# Patient Record
Sex: Male | Born: 1950 | Race: White | Hispanic: No | Marital: Single | Smoking: Never smoker
Health system: Southern US, Community
[De-identification: ages and names within clinical notes are randomized; demographics above are authoritative.]

## PROBLEM LIST (undated history)

## (undated) DIAGNOSIS — F32A Depression, unspecified: Secondary | ICD-10-CM

## (undated) DIAGNOSIS — K5792 Diverticulitis of intestine, part unspecified, without perforation or abscess without bleeding: Secondary | ICD-10-CM

## (undated) DIAGNOSIS — G4730 Sleep apnea, unspecified: Secondary | ICD-10-CM

## (undated) DIAGNOSIS — IMO0001 Reserved for inherently not codable concepts without codable children: Secondary | ICD-10-CM

## (undated) DIAGNOSIS — J40 Bronchitis, not specified as acute or chronic: Secondary | ICD-10-CM

## (undated) DIAGNOSIS — N400 Enlarged prostate without lower urinary tract symptoms: Secondary | ICD-10-CM

## (undated) DIAGNOSIS — F329 Major depressive disorder, single episode, unspecified: Secondary | ICD-10-CM

## (undated) DIAGNOSIS — K649 Unspecified hemorrhoids: Secondary | ICD-10-CM

## (undated) DIAGNOSIS — I10 Essential (primary) hypertension: Secondary | ICD-10-CM

## (undated) HISTORY — PX: COLONOSCOPY: SHX174

## (undated) HISTORY — PX: HERNIA REPAIR: SHX51

## (undated) HISTORY — PX: SIGMOIDOSCOPY: SUR1295

---

## 2015-02-12 ENCOUNTER — Encounter (HOSPITAL_COMMUNITY): Payer: Self-pay | Admitting: *Deleted

## 2015-02-12 ENCOUNTER — Emergency Department (HOSPITAL_COMMUNITY)
Admission: EM | Admit: 2015-02-12 | Discharge: 2015-02-12 | Disposition: A | Discharge status: Home / Self Care | Payer: Medicare Other | Attending: Emergency Medicine | Admitting: Emergency Medicine

## 2015-02-12 DIAGNOSIS — R509 Fever, unspecified: Secondary | ICD-10-CM | POA: Diagnosis not present

## 2015-02-12 DIAGNOSIS — R140 Abdominal distension (gaseous): Secondary | ICD-10-CM | POA: Insufficient documentation

## 2015-02-12 DIAGNOSIS — R110 Nausea: Secondary | ICD-10-CM | POA: Diagnosis not present

## 2015-02-12 DIAGNOSIS — I10 Essential (primary) hypertension: Secondary | ICD-10-CM | POA: Insufficient documentation

## 2015-02-12 DIAGNOSIS — R1084 Generalized abdominal pain: Secondary | ICD-10-CM | POA: Insufficient documentation

## 2015-02-12 DIAGNOSIS — Z79899 Other long term (current) drug therapy: Secondary | ICD-10-CM | POA: Insufficient documentation

## 2015-02-12 DIAGNOSIS — Z8719 Personal history of other diseases of the digestive system: Secondary | ICD-10-CM | POA: Diagnosis not present

## 2015-02-12 HISTORY — DX: Diverticulitis of intestine, part unspecified, without perforation or abscess without bleeding: K57.92

## 2015-02-12 HISTORY — DX: Essential (primary) hypertension: I10

## 2015-02-12 LAB — CBC
HEMATOCRIT: 48 % (ref 39.0–52.0)
Hemoglobin: 17.1 g/dL — ABNORMAL HIGH (ref 13.0–17.0)
MCH: 31.7 pg (ref 26.0–34.0)
MCHC: 35.6 g/dL (ref 30.0–36.0)
MCV: 88.9 fL (ref 78.0–100.0)
Platelets: 186 10*3/uL (ref 150–400)
RBC: 5.4 MIL/uL (ref 4.22–5.81)
RDW: 12.4 % (ref 11.5–15.5)
WBC: 8.1 10*3/uL (ref 4.0–10.5)

## 2015-02-12 LAB — COMPREHENSIVE METABOLIC PANEL
ALT: 20 U/L (ref 17–63)
ANION GAP: 9 (ref 5–15)
AST: 18 U/L (ref 15–41)
Albumin: 4.3 g/dL (ref 3.5–5.0)
Alkaline Phosphatase: 58 U/L (ref 38–126)
BUN: 11 mg/dL (ref 6–20)
CHLORIDE: 99 mmol/L — AB (ref 101–111)
CO2: 23 mmol/L (ref 22–32)
Calcium: 9.5 mg/dL (ref 8.9–10.3)
Creatinine, Ser: 0.81 mg/dL (ref 0.61–1.24)
Glucose, Bld: 119 mg/dL — ABNORMAL HIGH (ref 65–99)
POTASSIUM: 4 mmol/L (ref 3.5–5.1)
Sodium: 131 mmol/L — ABNORMAL LOW (ref 135–145)
TOTAL PROTEIN: 7.3 g/dL (ref 6.5–8.1)
Total Bilirubin: 0.9 mg/dL (ref 0.3–1.2)

## 2015-02-12 LAB — URINALYSIS, ROUTINE W REFLEX MICROSCOPIC
Glucose, UA: NEGATIVE mg/dL
Hgb urine dipstick: NEGATIVE
KETONES UR: 40 mg/dL — AB
LEUKOCYTES UA: NEGATIVE
NITRITE: NEGATIVE
PH: 5 (ref 5.0–8.0)
Protein, ur: 30 mg/dL — AB
SPECIFIC GRAVITY, URINE: 1.027 (ref 1.005–1.030)
UROBILINOGEN UA: 1 mg/dL (ref 0.0–1.0)

## 2015-02-12 LAB — URINE MICROSCOPIC-ADD ON

## 2015-02-12 LAB — LIPASE, BLOOD: LIPASE: 18 U/L — AB (ref 22–51)

## 2015-02-12 MED ORDER — HYDROCODONE-ACETAMINOPHEN 5-325 MG PO TABS: 2.0000 | ORAL_TABLET | ORAL | Status: DC | PRN

## 2015-02-12 MED ORDER — CIPROFLOXACIN HCL 500 MG PO TABS: 500.0000 mg | ORAL_TABLET | Freq: Two times a day (BID) | ORAL | Status: DC

## 2015-02-12 MED ORDER — HYOSCYAMINE SULFATE 0.125 MG SL SUBL
0.1250 mg | SUBLINGUAL_TABLET | Freq: Once | SUBLINGUAL | Status: AC
Start: 2015-02-12 — End: 2015-02-12
  Administered 2015-02-12: 0.125 mg via SUBLINGUAL

## 2015-02-12 MED ORDER — CIPROFLOXACIN HCL 500 MG PO TABS
500.0000 mg | ORAL_TABLET | Freq: Once | ORAL | Status: AC
  Administered 2015-02-12: 500 mg via ORAL
  Filled 2015-02-12: qty 1

## 2015-02-12 MED ORDER — METRONIDAZOLE 500 MG PO TABS
500.0000 mg | ORAL_TABLET | Freq: Once | ORAL | Status: AC
  Administered 2015-02-12: 500 mg via ORAL
  Filled 2015-02-12: qty 1

## 2015-02-12 MED ORDER — HYOSCYAMINE SULFATE 0.125 MG PO TABS
0.1250 mg | ORAL_TABLET | Freq: Once | ORAL | Status: DC
  Filled 2015-02-12: qty 1

## 2015-02-12 MED ORDER — ONDANSETRON HCL 4 MG/2ML IJ SOLN: 4.0000 mg | Freq: Once | INTRAMUSCULAR | Status: DC

## 2015-02-12 MED ORDER — SODIUM CHLORIDE 0.9 % IV BOLUS (SEPSIS): 1000.0000 mL | Freq: Once | INTRAVENOUS | Status: DC

## 2015-02-12 MED ORDER — METRONIDAZOLE 500 MG PO TABS: 500.0000 mg | ORAL_TABLET | Freq: Three times a day (TID) | ORAL | Status: DC

## 2015-02-12 MED ORDER — MORPHINE SULFATE (PF) 4 MG/ML IV SOLN: 4.0000 mg | Freq: Once | INTRAVENOUS | Status: DC

## 2015-02-12 NOTE — ED Notes (Signed)
EDP at bedside.  Pt given urinal to give urine sample.

## 2015-02-12 NOTE — ED Notes (Signed)
Pt has history of diverticulitis and here with lower abdominal pain and denies blood in stool.

## 2015-02-12 NOTE — ED Provider Notes (Signed)
CSN: [id number]     Arrival date & time 02/12/15  1517 History   First MD Initiated Contact with Patient 02/12/15 1707     Chief Complaint  Patient presents with  . Abdominal Pain   Jimmy Pearson is a 64 y.o. male with a past medical history of hypertension and diverticulitis who presents with abdominal pain, nausea, subjective fevers and chills, and abdominal distention. The patient reports that he has had diverticulitis "a bunch of times before" in [place]. The patient reports that he recently moved back to [place] and has complained of abdominal pain for the last 3 days. The patient says that his pain feels similar to his prior episodes and he describes a pain all over his abdomen. The patient says he feels his abdomen is slightly more distended than normal and feels harder than normal. The patient reports pain that comes and goes and is described as a tightness and cramping pain. The patient describes the pain is "extremely severe". The patient reports having slightly smaller bowel movements but denies constipation diarrhea or rectal bleeding. The patient denies any dysuria, hematuria, or difficulty with urination. The patient reports having subjective fevers and chills for the last few days but was afebrile on arrival. The patient denies any chest pain shortness of breath, vomiting, or any other complaints. The patient did describe having some mild nausea. The patient says his abdominal pain did not radiate, was not associated with eating, and happened all throughout the day and night. The patient reports that he does not want "a large workup" because he is concerned about his insurance status currently.   (Consider location/radiation/quality/duration/timing/severity/associated sxs/prior Treatment) Patient is a 64 y.o. male presenting with abdominal pain. The history is provided by the patient. No language interpreter was used.  Abdominal Pain Pain location:  Generalized Pain quality:  aching and cramping   Pain radiates to:  Does not radiate Pain severity:  Severe Onset quality:  Gradual Duration:  4 days Timing:  Intermittent Progression:  Waxing and waning Chronicity:  Recurrent Context: not previous surgeries and not trauma   Relieved by:  Nothing Worsened by:  Nothing tried Ineffective treatments:  None tried Associated symptoms: chills, fever and nausea   Associated symptoms: no chest pain, no constipation, no cough, no diarrhea, no dysuria, no fatigue, no hematemesis, no hematuria, no melena, no shortness of breath and no vomiting   Fever:    Duration:  3 days   Timing:  Intermittent   Temp source:  Subjective   Progression:  Resolved Nausea:    Severity:  Moderate   Onset quality:  Gradual   Duration:  3 days   Timing:  Intermittent   Progression:  Waxing and waning Risk factors: obesity   Risk factors: has not had multiple surgeries     Past Medical History  Diagnosis Date  . Diverticulitis   . Hypertension    Past Surgical History  Procedure Laterality Date  . Hernia repair     No family history on file. Social History  Substance Use Topics  . Smoking status: Never Smoker   . Smokeless tobacco: None  . Alcohol Use: Yes     Comment: wine with meals    Review of Systems  Constitutional: Positive for fever and chills. Negative for diaphoresis, appetite change and fatigue.  HENT: Negative for congestion and rhinorrhea.   Respiratory: Negative for cough, chest tightness, shortness of breath, wheezing and stridor.   Cardiovascular: Negative for chest pain, palpitations and leg  swelling.  Gastrointestinal: Positive for nausea, abdominal pain and abdominal distention. Negative for vomiting, diarrhea, constipation, blood in stool, melena and hematemesis.  Genitourinary: Negative for dysuria, urgency, hematuria, flank pain, discharge, penile pain and testicular pain.  Musculoskeletal: Negative for back pain, neck pain and neck stiffness.   Skin: Negative for rash and wound.  Neurological: Negative for seizures and headaches.  All other systems reviewed and are negative.     Allergies  Review of patient's allergies indicates no known allergies.  Home Medications   Prior to Admission medications   Medication Sig Start Date End Date Taking? Authorizing Provider  amLODipine (NORVASC) 5 MG tablet Take 5 mg by mouth daily.   Yes Historical Provider, MD  benazepril (LOTENSIN) 20 MG tablet Take 20 mg by mouth every evening.   Yes Historical Provider, MD  finasteride (PROSCAR) 5 MG tablet Take 5 mg by mouth daily.   Yes Historical Provider, MD  ibuprofen (ADVIL,MOTRIN) 200 MG tablet Take 400 mg by mouth every 4 (four) hours as needed for mild pain.   Yes Historical Provider, MD  OVER THE COUNTER MEDICATION Take 40 mLs by mouth daily. "Purified Silver"   Yes Historical Provider, MD   BP 161/102 mmHg  Pulse 90  Temp(Src) 98.4 F (36.9 C) (Oral)  Resp 20  Ht  (1.727 m)  Wt 230 lb (104.327 kg)  BMI 34.98 kg/m2  SpO2 99% Physical Exam  Constitutional: He is oriented to person, place, and time. He appears well-developed and well-nourished. No distress.  HENT:  Head: Normocephalic and atraumatic.  Mouth/Throat: No oropharyngeal exudate.  Eyes: Conjunctivae and EOM are normal. Pupils are equal, round, and reactive to light.  Neck: Normal range of motion.  Cardiovascular: Normal rate, normal heart sounds and intact distal pulses.   No murmur heard. Pulmonary/Chest: Effort normal. No stridor. He exhibits no tenderness.  Abdominal: Soft. He exhibits distension. There is generalized tenderness. There is no rigidity, no rebound, no guarding and no CVA tenderness.  Musculoskeletal: He exhibits no tenderness.  Neurological: He is alert and oriented to person, place, and time. He exhibits normal muscle tone.  Skin: Skin is warm. He is not diaphoretic. No erythema.  Psychiatric: He has a normal mood and affect.  Nursing note  and vitals reviewed.   ED Course  Procedures (including critical care time) Labs Review Labs Reviewed  LIPASE, BLOOD - Abnormal; Notable for the following:    Lipase 18 (*)    All other components within normal limits  COMPREHENSIVE METABOLIC PANEL - Abnormal; Notable for the following:    Sodium 131 (*)    Chloride 99 (*)    Glucose, Bld 119 (*)    All other components within normal limits  CBC - Abnormal; Notable for the following:    Hemoglobin 17.1 (*)    All other components within normal limits  URINALYSIS, ROUTINE W REFLEX MICROSCOPIC (NOT AT [facility]) - Abnormal; Notable for the following:    Color, Urine ORANGE (*)    Bilirubin Urine MODERATE (*)    Ketones, ur 40 (*)    Protein, ur 30 (*)    All other components within normal limits  URINE MICROSCOPIC-ADD ON - Abnormal; Notable for the following:    Casts HYALINE CASTS (*)    All other components within normal limits  URINE CULTURE    Imaging Review No results found. I have personally reviewed and evaluated these images and lab results as part of my medical decision-making.   EKG Interpretation  None      MDM   Jimmy Pearson is a 63 y.o. male with a past medical history of hypertension and diverticulitis who presents with abdominal pain, nausea, subjective fevers and chills, and abdominal distention. The patient's primary concern is for diverticulitis given his similar presentation and symptoms in the past with that diagnosis. Based on the patient's abdominal pain, nausea, and subjective fevers and chills, diverticulitis is in the differential diagnosis list; however, other etiologies are also considered including mass, abscess, inflammatory bowel disease, incarcerated hernia, obstruction, and ischemic bowel.   The patient's initial blood testing as drawn by nursing orders in triage revealed a on elevated lipase, a grossly unremarkable CMP with normal LFTs and kidney function, and an unremarkable CBC. A lactic acid was  considered however, the patient is very concerned about the cost of his visit and he is requesting that not be drawn at this time. The patient agreed to have a urinalysis to look for evidence of infection in his urine. A CT scan of the abdomen and pelvis was ordered by this examiner however, the patient reported that he did not want to pay for that type of test at this time. The patient is primarily concerned that this is diverticulitis and he is not interested in having other imaging done today.   The patient had diagnostic laboratory and imaging results as seen above. The patient's urinalysis did not show evidence of infection. The patient's lipase was not elevated, his CMP did not reveal evidence of significant electrolyte abnormality or kidney injury. The patient's liver function tests were unremarkable. The patient's CBC did not show evidence of leukocytosis or anemia.  The patient was reassured about his lab results. As the patient is deferring the rest of his workup including imaging at this time, the patient will be empirically treated with Cipro and Flagyl for presumed diverticulitis given the similarity to prior episodes. The patient will be given prescriptions for antibiotics at discharge and the patient will be given a prescription for several days of pain medication. The patient was instructed to follow-up with a primary care provider for further ongoing management of his diverticulitis and symptoms.   The patient did not have any other problems or any emergent department and was discharged in good condition. The patient understood return precautions for signs and symptoms of worsening diverticulitis, perforation, or other problems. Next  This patient was seen with Dr. Madilyn Hook, emergency medicine attending.   Final diagnoses:  Generalized abdominal pain        Theda Belfast, MD 02/13/15 [id number]  Tilden Fossa, MD 02/15/15 1344

## 2015-02-13 LAB — URINE CULTURE: CULTURE: NO GROWTH

## 2015-02-26 DIAGNOSIS — N400 Enlarged prostate without lower urinary tract symptoms: Secondary | ICD-10-CM | POA: Diagnosis not present

## 2015-02-26 DIAGNOSIS — Z6833 Body mass index (BMI) 33.0-33.9, adult: Secondary | ICD-10-CM | POA: Diagnosis not present

## 2015-02-26 DIAGNOSIS — L509 Urticaria, unspecified: Secondary | ICD-10-CM | POA: Diagnosis not present

## 2015-02-26 DIAGNOSIS — I10 Essential (primary) hypertension: Secondary | ICD-10-CM | POA: Diagnosis not present

## 2015-02-26 DIAGNOSIS — Z789 Other specified health status: Secondary | ICD-10-CM | POA: Diagnosis not present

## 2015-02-26 DIAGNOSIS — E668 Other obesity: Secondary | ICD-10-CM | POA: Diagnosis not present

## 2015-02-26 DIAGNOSIS — Z418 Encounter for other procedures for purposes other than remedying health state: Secondary | ICD-10-CM | POA: Diagnosis not present

## 2015-09-11 ENCOUNTER — Inpatient Hospital Stay (HOSPITAL_COMMUNITY)
Admission: EM | Admit: 2015-09-11 | Discharge: 2015-09-16 | DRG: 330 | Disposition: A | Discharge status: Home / Self Care | Payer: Medicare Other | Attending: General Surgery | Admitting: General Surgery

## 2015-09-11 ENCOUNTER — Encounter (HOSPITAL_COMMUNITY): Admission: EM | Disposition: A | Discharge status: Home / Self Care | Payer: Self-pay | Source: Home / Self Care

## 2015-09-11 ENCOUNTER — Emergency Department (HOSPITAL_COMMUNITY): Payer: Medicare Other

## 2015-09-11 ENCOUNTER — Inpatient Hospital Stay (HOSPITAL_COMMUNITY): Payer: Medicare Other | Admitting: Certified Registered Nurse Anesthetist

## 2015-09-11 ENCOUNTER — Encounter (HOSPITAL_COMMUNITY): Payer: Self-pay | Admitting: Emergency Medicine

## 2015-09-11 DIAGNOSIS — K5732 Diverticulitis of large intestine without perforation or abscess without bleeding: Secondary | ICD-10-CM | POA: Diagnosis not present

## 2015-09-11 DIAGNOSIS — E662 Morbid (severe) obesity with alveolar hypoventilation: Secondary | ICD-10-CM | POA: Diagnosis not present

## 2015-09-11 DIAGNOSIS — J9621 Acute and chronic respiratory failure with hypoxia: Secondary | ICD-10-CM | POA: Diagnosis not present

## 2015-09-11 DIAGNOSIS — Z800 Family history of malignant neoplasm of digestive organs: Secondary | ICD-10-CM | POA: Diagnosis not present

## 2015-09-11 DIAGNOSIS — I509 Heart failure, unspecified: Secondary | ICD-10-CM | POA: Diagnosis not present

## 2015-09-11 DIAGNOSIS — G4733 Obstructive sleep apnea (adult) (pediatric): Secondary | ICD-10-CM | POA: Diagnosis not present

## 2015-09-11 DIAGNOSIS — K5750 Diverticulosis of both small and large intestine without perforation or abscess without bleeding: Secondary | ICD-10-CM | POA: Diagnosis not present

## 2015-09-11 DIAGNOSIS — N400 Enlarged prostate without lower urinary tract symptoms: Secondary | ICD-10-CM | POA: Diagnosis present

## 2015-09-11 DIAGNOSIS — R0902 Hypoxemia: Secondary | ICD-10-CM | POA: Diagnosis not present

## 2015-09-11 DIAGNOSIS — K5900 Constipation, unspecified: Secondary | ICD-10-CM | POA: Diagnosis not present

## 2015-09-11 DIAGNOSIS — I10 Essential (primary) hypertension: Secondary | ICD-10-CM | POA: Diagnosis not present

## 2015-09-11 DIAGNOSIS — R0982 Postnasal drip: Secondary | ICD-10-CM | POA: Diagnosis not present

## 2015-09-11 DIAGNOSIS — K639 Disease of intestine, unspecified: Secondary | ICD-10-CM | POA: Diagnosis not present

## 2015-09-11 DIAGNOSIS — K3580 Unspecified acute appendicitis: Secondary | ICD-10-CM | POA: Diagnosis present

## 2015-09-11 DIAGNOSIS — K5792 Diverticulitis of intestine, part unspecified, without perforation or abscess without bleeding: Secondary | ICD-10-CM | POA: Diagnosis not present

## 2015-09-11 DIAGNOSIS — E669 Obesity, unspecified: Secondary | ICD-10-CM | POA: Diagnosis present

## 2015-09-11 DIAGNOSIS — R1084 Generalized abdominal pain: Secondary | ICD-10-CM | POA: Diagnosis not present

## 2015-09-11 DIAGNOSIS — K5669 Other intestinal obstruction: Secondary | ICD-10-CM | POA: Diagnosis present

## 2015-09-11 DIAGNOSIS — K6389 Other specified diseases of intestine: Secondary | ICD-10-CM | POA: Diagnosis present

## 2015-09-11 DIAGNOSIS — R0602 Shortness of breath: Secondary | ICD-10-CM | POA: Diagnosis not present

## 2015-09-11 DIAGNOSIS — R1032 Left lower quadrant pain: Secondary | ICD-10-CM

## 2015-09-11 DIAGNOSIS — Z6833 Body mass index (BMI) 33.0-33.9, adult: Secondary | ICD-10-CM

## 2015-09-11 DIAGNOSIS — R109 Unspecified abdominal pain: Secondary | ICD-10-CM | POA: Diagnosis not present

## 2015-09-11 DIAGNOSIS — K56609 Unspecified intestinal obstruction, unspecified as to partial versus complete obstruction: Secondary | ICD-10-CM | POA: Diagnosis present

## 2015-09-11 DIAGNOSIS — K5660 Unspecified intestinal obstruction: Secondary | ICD-10-CM | POA: Diagnosis not present

## 2015-09-11 DIAGNOSIS — R110 Nausea: Secondary | ICD-10-CM | POA: Diagnosis not present

## 2015-09-11 HISTORY — PX: COLON RESECTION: SHX5231

## 2015-09-11 HISTORY — PX: COLOSTOMY: SHX63

## 2015-09-11 HISTORY — PX: LAPAROTOMY: SHX154

## 2015-09-11 LAB — URINALYSIS, ROUTINE W REFLEX MICROSCOPIC
GLUCOSE, UA: NEGATIVE mg/dL
Hgb urine dipstick: NEGATIVE
KETONES UR: 15 mg/dL — AB
LEUKOCYTES UA: NEGATIVE
NITRITE: NEGATIVE
PH: 5.5 (ref 5.0–8.0)
Protein, ur: NEGATIVE mg/dL
SPECIFIC GRAVITY, URINE: 1.031 — AB (ref 1.005–1.030)

## 2015-09-11 LAB — CBC WITH DIFFERENTIAL/PLATELET
BASOS ABS: 0 10*3/uL (ref 0.0–0.1)
BASOS PCT: 0 %
Eosinophils Absolute: 0 10*3/uL (ref 0.0–0.7)
Eosinophils Relative: 0 %
HEMATOCRIT: 47.7 % (ref 39.0–52.0)
HEMOGLOBIN: 15.6 g/dL (ref 13.0–17.0)
LYMPHS PCT: 16 %
Lymphs Abs: 1.1 10*3/uL (ref 0.7–4.0)
MCH: 30.3 pg (ref 26.0–34.0)
MCHC: 32.7 g/dL (ref 30.0–36.0)
MCV: 92.6 fL (ref 78.0–100.0)
Monocytes Absolute: 0.4 10*3/uL (ref 0.1–1.0)
Monocytes Relative: 5 %
NEUTROS ABS: 5.3 10*3/uL (ref 1.7–7.7)
NEUTROS PCT: 79 %
Platelets: 174 10*3/uL (ref 150–400)
RBC: 5.15 MIL/uL (ref 4.22–5.81)
RDW: 12.8 % (ref 11.5–15.5)
WBC: 6.7 10*3/uL (ref 4.0–10.5)

## 2015-09-11 LAB — COMPREHENSIVE METABOLIC PANEL
ALBUMIN: 4.1 g/dL (ref 3.5–5.0)
ALK PHOS: 56 U/L (ref 38–126)
ALT: 20 U/L (ref 17–63)
AST: 16 U/L (ref 15–41)
Anion gap: 12 (ref 5–15)
BILIRUBIN TOTAL: 0.8 mg/dL (ref 0.3–1.2)
BUN: 12 mg/dL (ref 6–20)
CO2: 24 mmol/L (ref 22–32)
CREATININE: 0.74 mg/dL (ref 0.61–1.24)
Calcium: 9.4 mg/dL (ref 8.9–10.3)
Chloride: 99 mmol/L — ABNORMAL LOW (ref 101–111)
GFR calc Af Amer: >60 mL/min (ref >60)
GLUCOSE: 127 mg/dL — AB (ref 65–99)
POTASSIUM: 3.9 mmol/L (ref 3.5–5.1)
Sodium: 135 mmol/L (ref 135–145)
TOTAL PROTEIN: 7.2 g/dL (ref 6.5–8.1)

## 2015-09-11 LAB — LIPASE, BLOOD: Lipase: 31 U/L (ref 11–51)

## 2015-09-11 LAB — GLUCOSE, CAPILLARY: Glucose-Capillary: 121 mg/dL — ABNORMAL HIGH (ref 65–99)

## 2015-09-11 SURGERY — COLON RESECTION
Anesthesia: General | Site: Abdomen

## 2015-09-11 MED ORDER — LACTATED RINGERS IV SOLN
INTRAVENOUS | Status: DC
  Administered 2015-09-11 (×3): via INTRAVENOUS

## 2015-09-11 MED ORDER — ONDANSETRON HCL 4 MG/2ML IJ SOLN
INTRAMUSCULAR | Status: AC
  Filled 2015-09-11: qty 2

## 2015-09-11 MED ORDER — PHENYLEPHRINE 40 MCG/ML (10ML) SYRINGE FOR IV PUSH (FOR BLOOD PRESSURE SUPPORT)
PREFILLED_SYRINGE | INTRAVENOUS | Status: AC
  Filled 2015-09-11: qty 10

## 2015-09-11 MED ORDER — BENAZEPRIL HCL 10 MG PO TABS
20.0000 mg | ORAL_TABLET | Freq: Every evening | ORAL | Status: DC
  Administered 2015-09-11: 20 mg via ORAL
  Filled 2015-09-11: qty 2

## 2015-09-11 MED ORDER — HYDROMORPHONE 1 MG/ML IV SOLN
INTRAVENOUS | Status: DC
  Administered 2015-09-11: 19:00:00 via INTRAVENOUS
  Administered 2015-09-12: 0.3 mg via INTRAVENOUS
  Administered 2015-09-12 (×2): 0.6 mg via INTRAVENOUS
  Administered 2015-09-12: 0.3 mg via INTRAVENOUS
  Administered 2015-09-12: 0.6 mg via INTRAVENOUS
  Administered 2015-09-12: 0.9 mg via INTRAVENOUS
  Administered 2015-09-13: 1.8 mg via INTRAVENOUS
  Administered 2015-09-13 (×2): 0.9 mg via INTRAVENOUS
  Administered 2015-09-13: 0.3 mg via INTRAVENOUS
  Administered 2015-09-13: 1.8 mg via INTRAVENOUS
  Administered 2015-09-14: 0.9 mg via INTRAVENOUS
  Administered 2015-09-14: 0.3 mg via INTRAVENOUS
  Administered 2015-09-14: 1.8 mg via INTRAVENOUS
  Administered 2015-09-14: 1.5 mg via INTRAVENOUS
  Administered 2015-09-14: 1.8 mg via INTRAVENOUS
  Administered 2015-09-14: 1.5 mg via INTRAVENOUS
  Administered 2015-09-15 (×2): 1.2 mg via INTRAVENOUS
  Administered 2015-09-15: 0.6 mg via INTRAVENOUS
  Filled 2015-09-11: qty 25

## 2015-09-11 MED ORDER — FINASTERIDE 5 MG PO TABS
5.0000 mg | ORAL_TABLET | Freq: Every day | ORAL | Status: DC
Start: 2015-09-11 — End: 2015-09-12
  Administered 2015-09-11: 5 mg via ORAL
  Filled 2015-09-11 (×2): qty 1

## 2015-09-11 MED ORDER — SODIUM CHLORIDE 0.9 % IV BOLUS (SEPSIS)
1000.0000 mL | Freq: Once | INTRAVENOUS | Status: AC
  Administered 2015-09-11: 1000 mL via INTRAVENOUS

## 2015-09-11 MED ORDER — MIDAZOLAM HCL 5 MG/5ML IJ SOLN
INTRAMUSCULAR | Status: DC | PRN
  Administered 2015-09-11: 2 mg via INTRAVENOUS

## 2015-09-11 MED ORDER — SUGAMMADEX SODIUM 500 MG/5ML IV SOLN
INTRAVENOUS | Status: DC | PRN
  Administered 2015-09-11: 250 mg via INTRAVENOUS

## 2015-09-11 MED ORDER — PHENYLEPHRINE HCL 10 MG/ML IJ SOLN
INTRAMUSCULAR | Status: DC | PRN
  Administered 2015-09-11: 160 ug via INTRAVENOUS
  Administered 2015-09-11: 240 ug via INTRAVENOUS

## 2015-09-11 MED ORDER — NALOXONE HCL 0.4 MG/ML IJ SOLN: 0.4000 mg | INTRAMUSCULAR | Status: DC | PRN

## 2015-09-11 MED ORDER — FENTANYL CITRATE (PF) 250 MCG/5ML IJ SOLN
INTRAMUSCULAR | Status: AC
  Filled 2015-09-11: qty 5

## 2015-09-11 MED ORDER — ONDANSETRON 4 MG PO TBDP
4.0000 mg | ORAL_TABLET | Freq: Four times a day (QID) | ORAL | Status: DC | PRN
  Filled 2015-09-11: qty 1

## 2015-09-11 MED ORDER — METHOCARBAMOL 500 MG PO TABS
ORAL_TABLET | ORAL | Status: AC
  Administered 2015-09-11: 500 mg via ORAL
  Filled 2015-09-11: qty 1

## 2015-09-11 MED ORDER — ONDANSETRON HCL 4 MG/2ML IJ SOLN
INTRAMUSCULAR | Status: DC | PRN
  Administered 2015-09-11: 4 mg via INTRAVENOUS

## 2015-09-11 MED ORDER — LACTATED RINGERS IV SOLN: INTRAVENOUS | Status: DC

## 2015-09-11 MED ORDER — HYDROMORPHONE HCL 1 MG/ML IJ SOLN: 1.0000 mg | INTRAMUSCULAR | Status: DC | PRN

## 2015-09-11 MED ORDER — PANTOPRAZOLE SODIUM 40 MG IV SOLR
40.0000 mg | Freq: Every day | INTRAVENOUS | Status: DC
  Administered 2015-09-11 – 2015-09-14 (×4): 40 mg via INTRAVENOUS
  Filled 2015-09-11 (×4): qty 40

## 2015-09-11 MED ORDER — MEPERIDINE HCL 25 MG/ML IJ SOLN: 6.2500 mg | INTRAMUSCULAR | Status: DC | PRN

## 2015-09-11 MED ORDER — DICYCLOMINE HCL 10 MG/ML IM SOLN
20.0000 mg | Freq: Once | INTRAMUSCULAR | Status: AC
  Administered 2015-09-11: 20 mg via INTRAMUSCULAR
  Filled 2015-09-11: qty 2

## 2015-09-11 MED ORDER — DEXTROSE 5 % IV SOLN
2.0000 g | Freq: Two times a day (BID) | INTRAVENOUS | Status: AC
  Administered 2015-09-11: 2 g via INTRAVENOUS
  Filled 2015-09-11: qty 2

## 2015-09-11 MED ORDER — POTASSIUM CHLORIDE IN NACL 20-0.9 MEQ/L-% IV SOLN
INTRAVENOUS | Status: DC
  Administered 2015-09-11: 22:00:00 via INTRAVENOUS
  Filled 2015-09-11 (×2): qty 1000

## 2015-09-11 MED ORDER — SODIUM CHLORIDE 0.9% FLUSH: 9.0000 mL | INTRAVENOUS | Status: DC | PRN

## 2015-09-11 MED ORDER — MIDAZOLAM HCL 2 MG/2ML IJ SOLN
INTRAMUSCULAR | Status: AC
  Filled 2015-09-11: qty 2

## 2015-09-11 MED ORDER — LACTATED RINGERS IV SOLN
INTRAVENOUS | Status: DC | PRN
  Administered 2015-09-11 (×2): via INTRAVENOUS

## 2015-09-11 MED ORDER — ONDANSETRON HCL 4 MG/2ML IJ SOLN: 4.0000 mg | Freq: Four times a day (QID) | INTRAMUSCULAR | Status: DC | PRN

## 2015-09-11 MED ORDER — PROPOFOL 10 MG/ML IV BOLUS
INTRAVENOUS | Status: DC | PRN
  Administered 2015-09-11: 200 mg via INTRAVENOUS

## 2015-09-11 MED ORDER — DIPHENHYDRAMINE HCL 12.5 MG/5ML PO ELIX: 12.5000 mg | ORAL_SOLUTION | Freq: Four times a day (QID) | ORAL | Status: DC | PRN

## 2015-09-11 MED ORDER — FENTANYL CITRATE (PF) 100 MCG/2ML IJ SOLN
INTRAMUSCULAR | Status: DC | PRN
  Administered 2015-09-11: 150 ug via INTRAVENOUS
  Administered 2015-09-11 (×4): 50 ug via INTRAVENOUS

## 2015-09-11 MED ORDER — MORPHINE SULFATE (PF) 4 MG/ML IV SOLN
4.0000 mg | Freq: Once | INTRAVENOUS | Status: AC | PRN
  Administered 2015-09-11: 4 mg via INTRAVENOUS
  Filled 2015-09-11: qty 1

## 2015-09-11 MED ORDER — HYDROMORPHONE HCL 1 MG/ML IJ SOLN: 0.2500 mg | INTRAMUSCULAR | Status: DC | PRN

## 2015-09-11 MED ORDER — SUCCINYLCHOLINE CHLORIDE 20 MG/ML IJ SOLN
INTRAMUSCULAR | Status: DC | PRN
  Administered 2015-09-11: 120 mg via INTRAVENOUS

## 2015-09-11 MED ORDER — ALBUMIN HUMAN 5 % IV SOLN
INTRAVENOUS | Status: DC | PRN
  Administered 2015-09-11 (×2): via INTRAVENOUS

## 2015-09-11 MED ORDER — HYDRALAZINE HCL 20 MG/ML IJ SOLN: 10.0000 mg | INTRAMUSCULAR | Status: DC | PRN

## 2015-09-11 MED ORDER — PROCHLORPERAZINE EDISYLATE 5 MG/ML IJ SOLN: 10.0000 mg | INTRAMUSCULAR | Status: DC | PRN

## 2015-09-11 MED ORDER — AMLODIPINE BESYLATE 5 MG PO TABS
5.0000 mg | ORAL_TABLET | Freq: Every day | ORAL | Status: DC
  Administered 2015-09-11 – 2015-09-12 (×2): 5 mg via ORAL
  Filled 2015-09-11 (×2): qty 1

## 2015-09-11 MED ORDER — ROCURONIUM BROMIDE 100 MG/10ML IV SOLN
INTRAVENOUS | Status: DC | PRN
  Administered 2015-09-11: 40 mg via INTRAVENOUS
  Administered 2015-09-11: 10 mg via INTRAVENOUS
  Administered 2015-09-11: 20 mg via INTRAVENOUS
  Administered 2015-09-11: 10 mg via INTRAVENOUS
  Administered 2015-09-11: 20 mg via INTRAVENOUS

## 2015-09-11 MED ORDER — DEXTROSE-NACL 5-0.9 % IV SOLN: INTRAVENOUS | Status: DC

## 2015-09-11 MED ORDER — LIDOCAINE HCL (CARDIAC) 20 MG/ML IV SOLN
INTRAVENOUS | Status: DC | PRN
  Administered 2015-09-11: 100 mg via INTRAVENOUS

## 2015-09-11 MED ORDER — HYDRALAZINE HCL 20 MG/ML IJ SOLN: 10.0000 mg | Freq: Four times a day (QID) | INTRAMUSCULAR | Status: DC | PRN

## 2015-09-11 MED ORDER — ROCURONIUM BROMIDE 50 MG/5ML IV SOLN
INTRAVENOUS | Status: AC
  Filled 2015-09-11: qty 1

## 2015-09-11 MED ORDER — HYDROMORPHONE 1 MG/ML IV SOLN
INTRAVENOUS | Status: AC
  Filled 2015-09-11: qty 25

## 2015-09-11 MED ORDER — IOPAMIDOL (ISOVUE-300) INJECTION 61%
100.0000 mL | Freq: Once | INTRAVENOUS | Status: AC | PRN
  Administered 2015-09-11: 100 mL via INTRAVENOUS

## 2015-09-11 MED ORDER — DIPHENHYDRAMINE HCL 50 MG/ML IJ SOLN: 12.5000 mg | Freq: Four times a day (QID) | INTRAMUSCULAR | Status: DC | PRN

## 2015-09-11 MED ORDER — METHOCARBAMOL 500 MG PO TABS
500.0000 mg | ORAL_TABLET | Freq: Four times a day (QID) | ORAL | Status: DC | PRN
  Administered 2015-09-11: 500 mg via ORAL

## 2015-09-11 MED ORDER — DEXTROSE 5 % IV SOLN
2.0000 g | INTRAVENOUS | Status: AC
  Administered 2015-09-11: 2 g via INTRAVENOUS
  Filled 2015-09-11: qty 2

## 2015-09-11 MED ORDER — 0.9 % SODIUM CHLORIDE (POUR BTL) OPTIME
TOPICAL | Status: DC | PRN
  Administered 2015-09-11 (×2): 1000 mL

## 2015-09-11 MED ORDER — ONDANSETRON HCL 4 MG/2ML IJ SOLN
4.0000 mg | Freq: Once | INTRAMUSCULAR | Status: AC
  Administered 2015-09-11: 4 mg via INTRAVENOUS
  Filled 2015-09-11: qty 2

## 2015-09-11 MED ORDER — PROPOFOL 10 MG/ML IV BOLUS
INTRAVENOUS | Status: AC
  Filled 2015-09-11: qty 20

## 2015-09-11 MED ORDER — PHENYLEPHRINE HCL 10 MG/ML IJ SOLN
10.0000 mg | INTRAVENOUS | Status: DC | PRN
  Administered 2015-09-11: 20 ug/min via INTRAVENOUS

## 2015-09-11 MED ORDER — ENOXAPARIN SODIUM 40 MG/0.4ML ~~LOC~~ SOLN
40.0000 mg | SUBCUTANEOUS | Status: DC
  Filled 2015-09-11: qty 0.4

## 2015-09-11 MED ORDER — DEXAMETHASONE SODIUM PHOSPHATE 10 MG/ML IJ SOLN
INTRAMUSCULAR | Status: DC | PRN
  Administered 2015-09-11: 10 mg via INTRAVENOUS

## 2015-09-11 SURGICAL SUPPLY — 63 items
BLADE SURG ROTATE 9660 (MISCELLANEOUS) ×4 IMPLANT
CANISTER SUCTION 2500CC (MISCELLANEOUS) ×4 IMPLANT
CHLORAPREP W/TINT 26ML (MISCELLANEOUS) ×4 IMPLANT
COVER MAYO STAND STRL (DRAPES) IMPLANT
COVER SURGICAL LIGHT HANDLE (MISCELLANEOUS) ×4 IMPLANT
DRAPE LAPAROSCOPIC ABDOMINAL (DRAPES) ×4 IMPLANT
DRAPE PROXIMA HALF (DRAPES) IMPLANT
DRAPE UTILITY XL STRL (DRAPES) IMPLANT
DRAPE WARM FLUID 44X44 (DRAPE) ×4 IMPLANT
DRSG OPSITE POSTOP 4X10 (GAUZE/BANDAGES/DRESSINGS) ×8 IMPLANT
DRSG OPSITE POSTOP 4X8 (GAUZE/BANDAGES/DRESSINGS) ×4 IMPLANT
ELECT BLADE 6.5 EXT (BLADE) ×4 IMPLANT
ELECT CAUTERY BLADE 6.4 (BLADE) ×4 IMPLANT
ELECT REM PT RETURN 9FT ADLT (ELECTROSURGICAL) ×4
ELECTRODE REM PT RTRN 9FT ADLT (ELECTROSURGICAL) ×2 IMPLANT
GLOVE BIO SURGEON STRL SZ7 (GLOVE) ×4 IMPLANT
GLOVE BIO SURGEON STRL SZ8 (GLOVE) ×4 IMPLANT
GLOVE BIOGEL PI IND STRL 7.0 (GLOVE) ×2 IMPLANT
GLOVE BIOGEL PI IND STRL 7.5 (GLOVE) ×2 IMPLANT
GLOVE BIOGEL PI IND STRL 8.5 (GLOVE) ×2 IMPLANT
GLOVE BIOGEL PI INDICATOR 7.0 (GLOVE) ×2
GLOVE BIOGEL PI INDICATOR 7.5 (GLOVE) ×2
GLOVE BIOGEL PI INDICATOR 8.5 (GLOVE) ×2
GLOVE SURG SS PI 7.0 STRL IVOR (GLOVE) ×4 IMPLANT
GOWN STRL REUS W/ TWL LRG LVL3 (GOWN DISPOSABLE) ×6 IMPLANT
GOWN STRL REUS W/TWL LRG LVL3 (GOWN DISPOSABLE) ×6
HANDLE SUCTION POOLE (INSTRUMENTS) ×2 IMPLANT
KIT BASIN OR (CUSTOM PROCEDURE TRAY) ×4 IMPLANT
KIT OSTOMY DRAINABLE 2.75 STR (WOUND CARE) ×4 IMPLANT
KIT ROOM TURNOVER OR (KITS) ×4 IMPLANT
LEGGING LITHOTOMY PAIR STRL (DRAPES) IMPLANT
LIGASURE IMPACT 36 18CM CVD LR (INSTRUMENTS) ×4 IMPLANT
NS IRRIG 1000ML POUR BTL (IV SOLUTION) ×8 IMPLANT
PACK GENERAL/GYN (CUSTOM PROCEDURE TRAY) ×4 IMPLANT
PAD ARMBOARD 7.5X6 YLW CONV (MISCELLANEOUS) ×4 IMPLANT
PENCIL BUTTON HOLSTER BLD 10FT (ELECTRODE) ×4 IMPLANT
RELOAD PROXIMATE 75MM BLUE (ENDOMECHANICALS) ×4 IMPLANT
SPONGE LAP 18X18 X RAY DECT (DISPOSABLE) ×20 IMPLANT
STAPLER CUT CVD 40MM GREEN (STAPLE) ×4 IMPLANT
STAPLER PROXIMATE 75MM BLUE (STAPLE) ×4 IMPLANT
STAPLER VISISTAT 35W (STAPLE) ×4 IMPLANT
SUCTION POOLE HANDLE (INSTRUMENTS) ×4
SUCTION POOLE TIP (SUCTIONS) ×4 IMPLANT
SURGILUBE 2OZ TUBE FLIPTOP (MISCELLANEOUS) IMPLANT
SUT PDS AB 1 TP1 96 (SUTURE) ×8 IMPLANT
SUT PROLENE 2 0 CT2 30 (SUTURE) ×4 IMPLANT
SUT PROLENE 2 0 KS (SUTURE) IMPLANT
SUT SILK 2 0 SH CR/8 (SUTURE) ×4 IMPLANT
SUT SILK 2 0 TIES 10X30 (SUTURE) ×4 IMPLANT
SUT SILK 2 0SH CR/8 30 (SUTURE) ×4 IMPLANT
SUT SILK 3 0 SH CR/8 (SUTURE) ×4 IMPLANT
SUT SILK 3 0 TIES 10X30 (SUTURE) ×4 IMPLANT
SUT VIC AB 3-0 SH 18 (SUTURE) IMPLANT
SUT VIC AB 3-0 SH 27 (SUTURE) ×2
SUT VIC AB 3-0 SH 27X BRD (SUTURE) ×2 IMPLANT
SYR BULB IRRIGATION 50ML (SYRINGE) IMPLANT
TOWEL OR 17X24 6PK STRL BLUE (TOWEL DISPOSABLE) ×4 IMPLANT
TOWEL OR 17X26 10 PK STRL BLUE (TOWEL DISPOSABLE) ×4 IMPLANT
TRAY FOLEY CATH 16FRSI W/METER (SET/KITS/TRAYS/PACK) ×4 IMPLANT
TRAY PROCTOSCOPIC FIBER OPTIC (SET/KITS/TRAYS/PACK) IMPLANT
TUBE CONNECTING 12'X1/4 (SUCTIONS)
TUBE CONNECTING 12X1/4 (SUCTIONS) IMPLANT
YANKAUER SUCT BULB TIP NO VENT (SUCTIONS) ×8 IMPLANT

## 2015-09-11 NOTE — Transfer of Care (Signed)
Immediate Anesthesia Transfer of Care Note  Patient: Jimmy Pearson  Procedure(s) Performed: Procedure(s): COLON RESECTION (N/A) EXPLORATORY LAPAROTOMY COLOSTOMY (N/A)  Patient Location: PACU  Anesthesia Type:General  Level of Consciousness: awake, alert  and oriented  Airway & Oxygen Therapy: Patient Spontanous Breathing and Patient connected to face mask oxygen  Post-op Assessment: Report given to RN and Post -op Vital signs reviewed and stable  Post vital signs: Reviewed and stable  Last Vitals:  Filed Vitals:   09/11/15 1834 09/11/15 1842  BP: 180/96 174/91  Pulse: 96 96  Temp: 36.1 C   Resp: 14 14    Last Pain:  Filed Vitals:   09/11/15 1844  PainSc: 0-No pain         Complications: No apparent anesthesia complications

## 2015-09-11 NOTE — ED Notes (Signed)
PA at bedside.

## 2015-09-11 NOTE — ED Notes (Signed)
PT ambulated to restroom without distress.  

## 2015-09-11 NOTE — ED Notes (Signed)
Patient transported to CT 

## 2015-09-11 NOTE — ED Provider Notes (Signed)
CSN: [id number]     Arrival date & time 09/11/15  0427 History   First MD Initiated Contact with Patient 09/11/15 0506     No chief complaint on file.    (Consider location/radiation/quality/duration/timing/severity/associated sxs/prior Treatment) HPI Comments: 65 year old male with a history of diverticulitis and hypertension presents to the emergency department for evaluation of abdominal pain. Patient reports that he has been experiencing generalized abdominal pain over the past 3 days. Symptoms have been worsening since onset. He now feels most of his discomfort in is left lower quadrant and left flank. He describes his pain as spasms and cramping. Pain is waxing and waning in severity without modifying factors. He correlates his pain to symptoms he previously experienced with diverticulitis. He reports that his most recent episode was 7 months ago which was treated with ciprofloxacin and Flagyl. Patient states that he has felt some mild nausea. He has been constipated and has not had a bowel movement in the last 3 days. He does feel as though his abdomen is more swollen and distended than normal. He has had no fever, vomiting, urinary symptoms. Abdominal surgical history significant for inguinal hernia repair. No medications taken PTA for symptoms.  The history is provided by the patient. No language interpreter was used.    Past Medical History  Diagnosis Date  . Diverticulitis   . Hypertension    Past Surgical History  Procedure Laterality Date  . Hernia repair     No family history on file. Social History  Substance Use Topics  . Smoking status: Never Smoker   . Smokeless tobacco: None  . Alcohol Use: Yes     Comment: wine with meals    Review of Systems  Constitutional: Negative for fever.  Respiratory: Negative for shortness of breath.   Cardiovascular: Negative for chest pain.  Gastrointestinal: Positive for nausea, abdominal pain and constipation. Negative for vomiting  and diarrhea.  Genitourinary: Negative for dysuria.  All other systems reviewed and are negative.   Allergies  Review of patient's allergies indicates no known allergies.  Home Medications   Prior to Admission medications   Medication Sig Start Date End Date Taking? Authorizing Provider  amLODipine (NORVASC) 5 MG tablet Take 5 mg by mouth daily.   Yes Historical Provider, MD  benazepril (LOTENSIN) 20 MG tablet Take 20 mg by mouth every evening.   Yes Historical Provider, MD  finasteride (PROSCAR) 5 MG tablet Take 5 mg by mouth daily.   Yes Historical Provider, MD  ciprofloxacin (CIPRO) 500 MG tablet Take 1 tablet (500 mg total) by mouth 2 (two) times daily. Patient not taking: Reported on 09/11/2015 02/12/15   Theda Belfast, MD  HYDROcodone-acetaminophen (NORCO/VICODIN) 5-325 MG tablet Take 2 tablets by mouth every 4 (four) hours as needed for severe pain. Patient not taking: Reported on 09/11/2015 02/12/15   Theda Belfast, MD  metroNIDAZOLE (FLAGYL) 500 MG tablet Take 1 tablet (500 mg total) by mouth 3 (three) times daily. Patient not taking: Reported on 09/11/2015 02/12/15   Theda Belfast, MD   BP 130/92 mmHg  Pulse 73  Temp(Src) 98.2 F (36.8 C) (Oral)  Resp 17  Ht  (1.727 m)  Wt 108.863 kg  BMI 36.50 kg/m2  SpO2 97%   Physical Exam  Constitutional: He is oriented to person, place, and time. He appears well-developed and well-nourished. No distress.   Nontoxic appearing. Calm, in no distress.  HENT:  Head: Normocephalic and atraumatic.  Eyes: Conjunctivae and EOM are normal. No scleral  icterus.  Neck: Normal range of motion.  Cardiovascular: Normal rate, regular rhythm and intact distal pulses.   Pulmonary/Chest: Effort normal and breath sounds normal. No respiratory distress. He has no wheezes. He has no rales.   Respirations even and unlabored  Abdominal: Soft. He exhibits distension. There is tenderness. There is no rebound and no guarding.  Distended abdomen with TTP in  the LUQ and LLQ.  Hyperactive bowel sounds noted in the bilateral lower quadrants with hypoactive bowel sounds in the bilateral upper quadrants. No masses palpated, though exam is limited secondary to body habitus.  Musculoskeletal: Normal range of motion.  Neurological: He is alert and oriented to person, place, and time. He exhibits normal muscle tone. Coordination normal.  GCS 15. Patient moving all extremities.  Skin: Skin is warm and dry. No rash noted. He is not diaphoretic. No erythema. No pallor.  Psychiatric: He has a normal mood and affect. His behavior is normal.  Nursing note and vitals reviewed.   ED Course  Procedures (including critical care time) Labs Review Labs Reviewed  COMPREHENSIVE METABOLIC PANEL - Abnormal; Notable for the following:    Chloride 99 (*)    Glucose, Bld 127 (*)    All other components within normal limits  URINALYSIS, ROUTINE W REFLEX MICROSCOPIC (NOT AT [facility]) - Abnormal; Notable for the following:    Color, Urine AMBER (*)    APPearance CLOUDY (*)    Specific Gravity, Urine 1.031 (*)    Bilirubin Urine SMALL (*)    Ketones, ur 15 (*)    All other components within normal limits  CBC WITH DIFFERENTIAL/PLATELET  LIPASE, BLOOD    Imaging Review No results found.   I have personally reviewed and evaluated these images and lab results as part of my medical decision-making.   EKG Interpretation None      MDM   Final diagnoses:  Left lower quadrant pain    65 year old male with a history of diverticulitis presents to the emergency department for evaluation of abdominal pain. Patient with reassuring laboratory workup. Pain has improves slightly with Bentyl. Morphine ordered on an as-needed basis for additional pain control. Patient is pending a CT scan to further evaluate his symptoms. Patient care signed out to [facility] Lawyer, PA-C at change of shift who will follow-up on imaging and disposition appropriately.   Filed Vitals:    09/11/15 0530 09/11/15 0543 09/11/15 0600 09/11/15 0630  BP: 132/96  126/96 130/92  Pulse: 82  79 73  Temp:      TempSrc:      Resp: 11  10 17   Height:      Weight:      SpO2: 99% 100% 94% 97%     Antony MaduraKelly Genee Rann, PA-C 09/11/15 [id number]  Geoffery Lyonsouglas Delo, MD 09/11/15 [phone]

## 2015-09-11 NOTE — Anesthesia Procedure Notes (Signed)
Procedure Name: Intubation Date/Time: 09/11/2015 2:50 PM Performed by: Fabian NovemberSOLHEIM, Jimmy Pamer SALOMAN Pre-anesthesia Checklist: Patient identified, Timeout performed, Emergency Drugs available, Suction available and Patient being monitored Patient Re-evaluated:Patient Re-evaluated prior to inductionOxygen Delivery Method: Circle system utilized Preoxygenation: Pre-oxygenation with 100% oxygen Intubation Type: IV induction, Cricoid Pressure applied and Rapid sequence Laryngoscope Size: Glidescope and 4 Grade View: Grade I Tube type: Subglottic suction tube Tube size: 7.5 mm Number of attempts: 1 Airway Equipment and Method: Stylet Placement Confirmation: ETT inserted through vocal cords under direct vision,  positive ETCO2 and breath sounds checked- equal and bilateral Secured at: 24 cm Tube secured with: Tape Dental Injury: Teeth and Oropharynx as per pre-operative assessment  Difficulty Due To: Difficulty was anticipated, Difficult Airway- due to reduced neck mobility and Difficult Airway- due to limited oral opening Future Recommendations: Recommend- induction with short-acting agent, and alternative techniques readily available

## 2015-09-11 NOTE — Op Note (Signed)
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 Asst.: Dr. Madelin RearJosh Rickey Anesthesia: Gen. endotracheal Indications:  This is a 65 year old male with history of recurrent diverticulitis and a family history of colon cancer who presents with progressively worsening constipation and abdominal distention. This has progressed to nausea and vomiting. He was evaluated in the emerged department was noted to have normal labs. A CT scan showed a distal colonic obstruction secondary to a mass in the distal sigmoid colon. The colon is massively dilated and the cecum measures about 11.3 cm across with pneumatosis of the wall. There is no radiologic signs of perforation at this time. We saw the patient and recommended immediate exploration with resection of the colon. His risk of perforation is fairly high since he is obstructed.  Description of procedure: The patient brought to the operating room and placed in a supine position on the operating room table. After an adequate level of general anesthesia was obtained, a Foley catheter was placed under sterile technique. The patient's abdomen was prepped with ChloraPrep and draped sterile fashion. A timeout was taken to ensure the proper patient and proper procedure. We made a long midline incision. He had previously been marked by the ostomy nurse. We dissected down through the subcutaneous tissues tissues with cautery. The linea alba was opened vertically. We into the peritoneal cavity. A lot of free fluid was encountered. The massively dilated right colon immediately protruded through our fascial opening. We open the fascia widely. We were unable to adequately visualize entire colon because of the distention of the cecum. The serosa is split in the right colon and the cecum but there is no gross signs  of perforation. I placed a pursestring suture of 2-0 silk in the cecum. We made a small opening and evacuated the air as well as some of the liquid stool that was in the cecum. We tied down the Pershing suture to prevent any leak. We then mobilized the cecum and the appendix away from the sidewall. The small bowel seems to be densely adherent down into the pelvis. We mobilized this later in the case. We continued dissecting up along the descending colon around the hepatic flexure. The omentum was dissected away from the transverse colon to the area of the splenic flexure. We then divided the terminal ileum with a GIA-75 stapler. The LigaSure device was used to divide the mesentery of the cecum and a sending colon. The ileocolic vessels were ligated with 2-0 silk sutures. The right colic vessel was ligated with 2-0 silk. The middle colic was also ligated with 2-0 silk. We continued working torse splenic flexure. Splenic flexure is fairly high. I mobilized the left colon away from the lateral abdominal wall by dividing the white line of Toldt. Following the transverse colon as well as the proximal descending colon we were able to mobilize the splenic flexure down away from the left upper quadrant. We continued ligating the mesentery with the LigaSure device. We dissected down around the sigmoid colon. The anterior wall of rectum seems to be densely adherent to the posterior surface of the bladder. The small bowel is also adherent down into the pelvis. We spent some time dissecting the small bowel out of the pelvis and the rectum away from the posterior wall of bladder. Once we had this dissected away with the distal sigmoid colon and rectum mobilize more readily. There is an  obvious obstruction from a firm mass in the proximal end of the rectum. We continued dissecting the miso rectum down until we were about 5 cm or more distal to the mass. We skeletonized the rectum circumferentially. The rectum was then divided  with a green Contour stapler. The specimen was passed off the field labeled as:Marland Kitchen The rectal stump was inspected and was no longer bleeding. The corners of the rectal stump or tagged with 2-0 Prolene. We irrigated the entire abdomen thoroughly and inspected for hemostasis. Once we were satisfied with our hemostasis we inspected the terminal ileum. There no signs of ischemia or staple line. We excised a small circle skin at the site previously marked on the right by the ostomy nurse. We dissected down the fascia which was incised in a cruciate fashion. The ileostomy was exteriorized. We again inspected for hemostasis. The fascia was closed with double-stranded #1 PDS suture. The subcutaneous case tissues were irrigated and staples were used to close the skin. A honeycomb dressing was applied to the staple line. We then matured the ileostomy biapically the staple line and using interrupted 3-0 Vicryl sutures. An ostomy appliance was placed. The patient was then extubated and brought to recovery room in stable condition. All sponge, initially, and needle counts are correct.  Wilmon Arms. [name], MD, [facility] Surgery  General/ Trauma Surgery  09/11/2015 6:36 PM

## 2015-09-11 NOTE — ED Notes (Signed)
Surgical NP left bedside; RN checked on patient; suggested he call a friend or someone for support. No needs at this time. Provided active listening.

## 2015-09-11 NOTE — ED Notes (Addendum)
Pt states symptoms started to worsen on Monday. Pt reports generalized abdominal pain. Pt states pain is current pain is a 0/10 currently but occasionally when it spasms that it is a 10/10. Pt has been diagnosed is diverticulitis approx 7 months ago. Pt is nauseated. Pt abdomen is swollen. LBM is approx 3 days ago. Feels like organs are protruding and seem hard. Pt denies fevers but states he did feel febrile.

## 2015-09-11 NOTE — ED Notes (Signed)
Pt made aware need for urine.  

## 2015-09-11 NOTE — ED Notes (Signed)
Assisted pt to restroom  

## 2015-09-11 NOTE — Anesthesia Preprocedure Evaluation (Addendum)
Anesthesia Evaluation  Patient identified by MRN, date of birth, ID band Patient awake    Reviewed: Allergy & Precautions, NPO status , Patient's Chart, lab work & pertinent test results  History of Anesthesia Complications Negative for: history of anesthetic complications  Airway Mallampati: III  TM Distance: >3 FB Neck ROM: Full    Dental  (+) Teeth Intact, Dental Advisory Given   Pulmonary neg pulmonary ROS,    breath sounds clear to auscultation       Cardiovascular hypertension, Pt. on medications  Rhythm:Regular Rate:Normal     Neuro/Psych negative neurological ROS  negative psych ROS   GI/Hepatic Neg liver ROS,  + Nausea  Umbilical Hernia    Endo/Other  negative endocrine ROSMorbid obesity  Renal/GU negative Renal ROS  negative genitourinary   Musculoskeletal negative musculoskeletal ROS (+)   Abdominal   Peds negative pediatric ROS (+)  Hematology negative hematology ROS (+)   Anesthesia Other Findings   Reproductive/Obstetrics negative OB ROS                          BP Readings from Last 3 Encounters:  09/11/15 136/84  02/12/15 142/97   Lab Results  Component Value Date   WBC 6.7 09/11/2015   HGB 15.6 09/11/2015   HCT 47.7 09/11/2015   MCV 92.6 09/11/2015   PLT 174 09/11/2015     Chemistry      Component Value Date/Time   NA 135 09/11/2015 0532   K 3.9 09/11/2015 0532   CL 99* 09/11/2015 0532   CO2 24 09/11/2015 0532   BUN 12 09/11/2015 0532   CREATININE 0.74 09/11/2015 0532      Component Value Date/Time   CALCIUM 9.4 09/11/2015 0532   ALKPHOS 56 09/11/2015 0532   AST 16 09/11/2015 0532   ALT 20 09/11/2015 0532   BILITOT 0.8 09/11/2015 0532     No results found for: INR, PROTIME   Anesthesia Physical Anesthesia Plan  ASA: III  Anesthesia Plan: General   Post-op Pain Management:    Induction: Intravenous  Airway Management Planned: Oral  ETT  Additional Equipment:   Intra-op Plan:   Post-operative Plan: Possible Post-op intubation/ventilation  Informed Consent: I have reviewed the patients History and Physical, chart, labs and discussed the procedure including the risks, benefits and alternatives for the proposed anesthesia with the patient or authorized representative who has indicated his/her understanding and acceptance.   Dental advisory given  Plan Discussed with:   Anesthesia Plan Comments:         Anesthesia Quick Evaluation

## 2015-09-11 NOTE — H&P (Signed)
Chief Complaint: abdominal pain, constipation and nausea HPI: Jimmy Pearson is a 65 year old male with a history of diverticulitis, RIH repair with mesh and hypertension who presents with above complaints.  He reports over the last 3 years intermittently feeling a mass and feeling fullness on the left side.  This would resolve on its own. About 2 weeks ago, he began to develop distention, constipation(last BM was Sunday), pain on Monday and nausea this AM.  Prior to 2 weeks, his bowels were normal, appetite was good, he would get occasional hematochezia which he attributed to hemorrhoids.  His appetite has been fair over the last week.  Unsure of last colonoscopy, may have even been 20 years ago when he was diagnosed with diverticulitis.  Family history significant for father with colon cancer at age 26.  The patient was in the ED in October and treated for a bout of diverticulitis.  He declined a colonoscopy at this time.  ED work up reveals, normal WBC, normal renal and hepatic function.  CT of abdomen and pelvis reveals a distal colonic obstruction likely secondary to a mass, subtle pneumatosis and a dilated cecum without evidence of perforation.  We have therefore been asked to evaluate.    He drinks wine every night with dinner, but would not disclose the amount.  Reports no alcohol use in 1 week now.   Past Medical History  Diagnosis Date  . Diverticulitis   . Hypertension     Past Surgical History  Procedure Laterality Date  . Hernia repair      Family History  Problem Relation Age of Onset  . Colon cancer Father    Social History:  reports that he has never smoked. He does not have any smokeless tobacco history on file. He reports that he drinks alcohol. He reports that he does not use illicit drugs.  Allergies: No Known Allergies   (Not in a hospital admission)  Results for orders placed or performed during the hospital encounter of 09/11/15 (from the past 48 hour(s))  CBC  with Differential     Status: None   Collection Time: 09/11/15  5:32 AM  Result Value Ref Range   WBC 6.7 4.0 - 10.5 K/uL   RBC 5.15 4.22 - 5.81 MIL/uL   Hemoglobin 15.6 13.0 - 17.0 g/dL   HCT 47.7 39.0 - 52.0 %   MCV 92.6 78.0 - 100.0 fL   MCH 30.3 26.0 - 34.0 pg   MCHC 32.7 30.0 - 36.0 g/dL   RDW 12.8 11.5 - 15.5 %   Platelets 174 150 - 400 K/uL   Neutrophils Relative % 79 %   Neutro Abs 5.3 1.7 - 7.7 K/uL   Lymphocytes Relative 16 %   Lymphs Abs 1.1 0.7 - 4.0 K/uL   Monocytes Relative 5 %   Monocytes Absolute 0.4 0.1 - 1.0 K/uL   Eosinophils Relative 0 %   Eosinophils Absolute 0.0 0.0 - 0.7 K/uL   Basophils Relative 0 %   Basophils Absolute 0.0 0.0 - 0.1 K/uL  Comprehensive metabolic panel     Status: Abnormal   Collection Time: 09/11/15  5:32 AM  Result Value Ref Range   Sodium 135 135 - 145 mmol/L   Potassium 3.9 3.5 - 5.1 mmol/L   Chloride 99 (L) 101 - 111 mmol/L   CO2 24 22 - 32 mmol/L   Glucose, Bld 127 (H) 65 - 99 mg/dL   BUN 12 6 - 20 mg/dL   Creatinine, Ser  0.74 0.61 - 1.24 mg/dL   Calcium 9.4 8.9 - 10.3 mg/dL   Total Protein 7.2 6.5 - 8.1 g/dL   Albumin 4.1 3.5 - 5.0 g/dL   AST 16 15 - 41 U/L   ALT 20 17 - 63 U/L   Alkaline Phosphatase 56 38 - 126 U/L   Total Bilirubin 0.8 0.3 - 1.2 mg/dL   GFR calc non Af Amer >60 >60 mL/min   GFR calc Af Amer >60 >60 mL/min    Comment: (NOTE) The eGFR has been calculated using the CKD EPI equation. This calculation has not been validated in all clinical situations. eGFR's persistently <60 mL/min signify possible Chronic Kidney Disease.    Anion gap 12 5 - 15  Lipase, blood     Status: None   Collection Time: 09/11/15  5:32 AM  Result Value Ref Range   Lipase 31 11 - 51 U/L  Urinalysis, Routine w reflex microscopic (not at [facility])     Status: Abnormal   Collection Time: 09/11/15  6:35 AM  Result Value Ref Range   Color, Urine AMBER (A) YELLOW    Comment: BIOCHEMICALS MAY BE AFFECTED BY COLOR   APPearance CLOUDY (A)  CLEAR   Specific Gravity, Urine 1.031 (H) 1.005 - 1.030   pH 5.5 5.0 - 8.0   Glucose, UA NEGATIVE NEGATIVE mg/dL   Hgb urine dipstick NEGATIVE NEGATIVE   Bilirubin Urine SMALL (A) NEGATIVE   Ketones, ur 15 (A) NEGATIVE mg/dL   Protein, ur NEGATIVE NEGATIVE mg/dL   Nitrite NEGATIVE NEGATIVE   Leukocytes, UA NEGATIVE NEGATIVE    Comment: MICROSCOPIC NOT DONE ON URINES WITH NEGATIVE PROTEIN, BLOOD, LEUKOCYTES, NITRITE, OR GLUCOSE <1000 mg/dL.   Ct Abdomen Pelvis W Contrast  09/11/2015  CLINICAL DATA:  65 year old male with diffuse lower abdominal pain, nausea and constipation. Past history of diverticulitis. EXAM: CT ABDOMEN AND PELVIS WITH CONTRAST TECHNIQUE: Multidetector CT imaging of the abdomen and pelvis was performed using the standard protocol following bolus administration of intravenous contrast. CONTRAST:  168m ISOVUE-300 IOPAMIDOL (ISOVUE-300) INJECTION 61% COMPARISON:  None. FINDINGS: Lower chest:  No acute findings.  Small hiatal hernia. Hepatobiliary: Small 7 mm circumscribed low-attenuation lesion in the superior aspect of hepatic segment 4A is too small characterize but statistically highly likely a benign cyst. The remainder the liver is normal in contour without cirrhotic morphology. The parenchyma is diffusely low in attenuation suggesting steatosis. Gallbladder is unremarkable. No intra or extrahepatic biliary ductal dilatation. Pancreas: No mass, inflammatory changes, or other significant abnormality. Spleen: Within normal limits in size and appearance. Adrenals/Urinary Tract: No masses identified. No evidence of hydronephrosis. Stomach/Bowel: Diffuse distention of the colon with an abrupt transition in the sigmoid colon were there is some evidence of irregular wall thickening concerning for an underlying obstructing mass. The region is a amorphous and somewhat difficult to measure but is between 3.5 and 6.4 cm in length. The cecum is markedly dilated at 11.3 cm in diameter. There is  subtle pneumatosis along the posterior wall of the cecum. Vascular/Lymphatic: No pathologically enlarged lymph nodes. No evidence of abdominal aortic aneurysm. Reproductive: No mass or other significant abnormality. Other: None. Musculoskeletal:  No suspicious bone lesions identified. IMPRESSION: 1. CT findings are concerning for distal colonic obstruction, likely secondary to a subtle and ill-defined mass measuring between 3.5 and 6.4 cm (precise measurement is difficult given the indistinct margins of the lesion). 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 The cecum is markedly dilated  measuring up to 11 cm. While there is no evidence of perforation at this time, there is subtle pneumatosis along the posterior wall of the cecum in the region of the ileocecal valve raising concern for impending perforation. Recommend surgical consultation. 2. Nonspecific 7 mm low-attenuation lesion in the superior aspect of hepatic segment 4A is too small to characterize but warrants attention on follow-up imaging. 3. Small hiatal hernia. 4. Hepatic steatosis. These results were called by telephone at the time of interpretation on 09/11/2015 at 8:17 am to Dr. Tomi Bamberger, who verbally acknowledged these results. Electronically Signed   By: Jacqulynn Cadet M.D.   On: 09/11/2015 08:17    Review of Systems  Constitutional: Negative for fever, chills, weight loss, malaise/fatigue and diaphoresis.  Eyes: Negative for blurred vision, double vision, photophobia, pain, discharge and redness.  Respiratory: Negative for cough, hemoptysis, sputum production, shortness of breath and wheezing.   Cardiovascular: Negative for chest pain, palpitations, orthopnea, claudication, leg swelling and PND.  Gastrointestinal: Positive for nausea, abdominal pain and constipation. Negative for heartburn, vomiting, diarrhea, blood in stool and melena.  Genitourinary: Negative for dysuria, urgency,  frequency, hematuria and flank pain.  Musculoskeletal: Negative for myalgias, back pain, joint pain, falls and neck pain.  Neurological: Negative for dizziness, tingling, tremors, sensory change, speech change, focal weakness, seizures, loss of consciousness and weakness.    Blood pressure 136/93, pulse 75, temperature 98.2 F (36.8 C), temperature source Oral, resp. rate 15, height _0  (1.727 m), weight 108.863 kg (240 lb), SpO2 93 %. Physical Exam  Constitutional: He appears well-developed and well-nourished. No distress.  Cardiovascular: Normal rate, regular rhythm, normal heart sounds and intact distal pulses.  Exam reveals no gallop and no friction rub.   No murmur heard. Respiratory: Effort normal and breath sounds normal. No respiratory distress. He has no wheezes. He has no rales. He exhibits no tenderness.  GI: He exhibits no mass. There is no rebound and no guarding.  Abdomen is distended, did not appreciate any masses on exam.  Umbilical hernia, reducible. Very little discomfort appreciated on exam to the llq.   Skin: He is not diaphoretic.     Assessment/Plan Colonic obstruction pneumatosis likely secondary to a mass, presumably a colon cancer.  Recommend hartmanns procedure today.  Surgical risks discussed including infection, bleeding, injury to surrounding structures, need for further surgery, anesthesia risks.  The patient verbalizes understanding and wishes to proceed.  Check [facility] [place] for colostomy  FEN-NPO, IVF, pain control HTN-hydralazine, hold home meds BPH-watch for urinary retention VTE prophylaxis-SCD Dispo-to OR, admit  Erby Pian, NP  Pager [phone] 09/11/2015, 9:20 AM

## 2015-09-11 NOTE — Consult Note (Addendum)
WOC requested for preoperative stoma site marking, surgery planned for this AM.  Discussed surgical procedure and stoma creation with patient.  Explained role of the WOC nurse team.  Provided the patient with educational booklet/DVD and provided samples of pouching options.  Answered patient questions and briefly demonstrated pouch appearance and functions..  Examined patient sitting, and standing in order to place the marking in the patient's visual field, away from any creases or abdominal contour issues and within the rectus muscle.  Attempted to mark below the patient's belt line.    Marked for colostomy in the LLQ  __7__ cm to the left of the umbilicus and _2___cm above/below the umbilicus. Marked for ileostomy in the RLQ  ___7_cm to the right of the umbilicus and  __2__ cm above/below the umbilicus.  WOC will follow post-op for educational sessions. Cammie Mcgeeawn Jamina Macbeth MSN, RN, CWOCN, HarrisWCN-AP, CNS [phone]

## 2015-09-11 NOTE — ED Provider Notes (Signed)
I spoke with general surgery about the patient and advised the patient of the findings that there is a possible cancerous mass obstructing his colon and that is the cause for his pain.  General surgery has evaluated the patient and taken him to the OR for intervention of this mass  Charlestine NightChristopher Kamill Fulbright, PA-C 09/11/15 1010  Linwood DibblesJon Knapp, MD 09/11/15 1128

## 2015-09-12 ENCOUNTER — Inpatient Hospital Stay (HOSPITAL_COMMUNITY): Payer: Medicare Other

## 2015-09-12 ENCOUNTER — Encounter (HOSPITAL_COMMUNITY): Payer: Self-pay | Admitting: Surgery

## 2015-09-12 DIAGNOSIS — K6389 Other specified diseases of intestine: Secondary | ICD-10-CM | POA: Insufficient documentation

## 2015-09-12 DIAGNOSIS — R0902 Hypoxemia: Secondary | ICD-10-CM | POA: Insufficient documentation

## 2015-09-12 LAB — COMPREHENSIVE METABOLIC PANEL
ALBUMIN: 3.4 g/dL — AB (ref 3.5–5.0)
ALT: 21 U/L (ref 17–63)
ANION GAP: 11 (ref 5–15)
AST: 22 U/L (ref 15–41)
Alkaline Phosphatase: 38 U/L (ref 38–126)
BILIRUBIN TOTAL: 1 mg/dL (ref 0.3–1.2)
BUN: 10 mg/dL (ref 6–20)
CO2: 31 mmol/L (ref 22–32)
Calcium: 8.7 mg/dL — ABNORMAL LOW (ref 8.9–10.3)
Chloride: 100 mmol/L — ABNORMAL LOW (ref 101–111)
Creatinine, Ser: 0.89 mg/dL (ref 0.61–1.24)
GFR calc non Af Amer: >60 mL/min (ref >60)
GLUCOSE: 128 mg/dL — AB (ref 65–99)
POTASSIUM: 5.1 mmol/L (ref 3.5–5.1)
SODIUM: 142 mmol/L (ref 135–145)
TOTAL PROTEIN: 5.8 g/dL — AB (ref 6.5–8.1)

## 2015-09-12 LAB — CBC
HEMATOCRIT: 45.2 % (ref 39.0–52.0)
HEMOGLOBIN: 14.5 g/dL (ref 13.0–17.0)
MCH: 30 pg (ref 26.0–34.0)
MCHC: 32.1 g/dL (ref 30.0–36.0)
MCV: 93.6 fL (ref 78.0–100.0)
Platelets: 160 10*3/uL (ref 150–400)
RBC: 4.83 MIL/uL (ref 4.22–5.81)
RDW: 13.1 % (ref 11.5–15.5)
WBC: 9.9 10*3/uL (ref 4.0–10.5)

## 2015-09-12 LAB — CEA: CEA: 1.6 ng/mL (ref 0.0–4.7)

## 2015-09-12 LAB — MRSA PCR SCREENING: MRSA by PCR: NEGATIVE

## 2015-09-12 MED ORDER — AMLODIPINE BESYLATE 5 MG PO TABS: 5.0000 mg | ORAL_TABLET | Freq: Every day | ORAL | Status: DC

## 2015-09-12 MED ORDER — BENAZEPRIL HCL 20 MG PO TABS
20.0000 mg | ORAL_TABLET | Freq: Every day | ORAL | Status: DC
  Administered 2015-09-12 – 2015-09-16 (×5): 20 mg via ORAL
  Filled 2015-09-12 (×3): qty 2
  Filled 2015-09-12 (×2): qty 1

## 2015-09-12 MED ORDER — AMLODIPINE BESYLATE 5 MG PO TABS
5.0000 mg | ORAL_TABLET | Freq: Every day | ORAL | Status: DC
  Administered 2015-09-13 – 2015-09-16 (×4): 5 mg via ORAL
  Filled 2015-09-12 (×5): qty 1

## 2015-09-12 MED ORDER — SODIUM CHLORIDE 0.9 % IV SOLN
INTRAVENOUS | Status: DC
  Administered 2015-09-12 – 2015-09-14 (×2): via INTRAVENOUS

## 2015-09-12 MED ORDER — FINASTERIDE 5 MG PO TABS
5.0000 mg | ORAL_TABLET | Freq: Every day | ORAL | Status: DC
  Administered 2015-09-12 – 2015-09-15 (×4): 5 mg via ORAL
  Filled 2015-09-12 (×4): qty 1

## 2015-09-12 NOTE — Consult Note (Signed)
WOC ostomy follow up Stoma type/location:  Ileostomy to RLQ from surgery yesterday. Stomal assessment/size:  Stoma red and viable when visualized through pouch, which is intact with good seal. Output: Scant amt pink drainage, no stool or flatus  Ostomy pouching: 2pc.  Education provided:  Briefly discussed pouching, will begin ostomy educational sessions and pouch change demonstrations when stable and out of ICU.  Supplies ordered to bedside for staff nurse use. Enrolled patient in [employer] Discharge program: No Cammie Mcgeeawn Donnica Jarnagin MSN, RN, Kathrynn HumbleCWOCN, CWCN-AP, [place]NS [phone]

## 2015-09-12 NOTE — Progress Notes (Signed)
Called patient's RN to check on respiratory status.Patient has no oxygen requirement when awake but desats when asleep. CPAP ordered.

## 2015-09-12 NOTE — Progress Notes (Signed)
1 Day Post-Op  Subjective: POD 1 s/p colectomy with end ileostomy for obstructing colon mass.  Desated overnight to low 80s when asleep with good response to venti mask (comes up to mid 90s).  States that pain is well controlled with PCA. Tolerated some sips of liquids.     Objective: Vital signs in last 24 hours: Temp:  [97 F (36.1 C)-99.8 F (37.7 C)] 97 F (36.1 C) (05/05 0837) Pulse Rate:  [77-96] 77 (05/05 0352) Resp:  [7-19] 17 (05/05 0800) BP: (136-180)/(77-98) 149/92 mmHg (05/05 0352) SpO2:  [92 %-100 %] 100 % (05/05 0800) FiO2 (%):  [55 %] 55 % (05/05 0800) Weight:  [104.9 kg (231 lb 4.2 oz)] 104.9 kg (231 lb 4.2 oz) (05/04 2055)    Intake/Output from previous day: 05/04 0701 - 05/05 0700 In: 5625 [I.V.:5125; IV Piggyback:500] Out: 1650 [Urine:1280; Blood:370] Intake/Output this shift: Total I/O In: 375 [I.V.:375] Out: -   General appearance: cooperative, no distress, moderately obese and Asleep, arousable.   Resp: Nonlabored repsirations.  Sats 98% on venti mask.  When awake and conversing maintains sats in mid 90s without mask. GI: normal findings: Soft, Mildly distended.  No rebound or guarding. and Appropriately tender to palpation.  ileostomy in RLQ in place.  Pink and viable.  Some air and bowel sweat in appliance.  Incision/Wound:  Lab Results:   Recent Labs  09/11/15 0532 09/12/15 0440  WBC 6.7 9.9  HGB 15.6 14.5  HCT 47.7 45.2  PLT 174 160   BMET  Recent Labs  09/11/15 0532 09/12/15 0440  NA 135 142  K 3.9 5.1  CL 99* 100*  CO2 24 31  GLUCOSE 127* 128*  BUN 12 10  CREATININE 0.74 0.89  CALCIUM 9.4 8.7*   PT/INR No results for input(s): LABPROT, INR in the last 72 hours. ABG No results for input(s): PHART, HCO3 in the last 72 hours.  Invalid input(s): PCO2, PO2  Studies/Results: Ct Abdomen Pelvis W Contrast  09/11/2015  CLINICAL DATA:  65 year old male with diffuse lower abdominal pain, nausea and constipation. Past history of  diverticulitis. EXAM: CT ABDOMEN AND PELVIS WITH CONTRAST TECHNIQUE: Multidetector CT imaging of the abdomen and pelvis was performed using the standard protocol following bolus administration of intravenous contrast. CONTRAST:  100mL ISOVUE-300 IOPAMIDOL (ISOVUE-300) INJECTION 61% COMPARISON:  None. FINDINGS: Lower chest:  No acute findings.  Small hiatal hernia. Hepatobiliary: Small 7 mm circumscribed low-attenuation lesion in the superior aspect of hepatic segment 4A is too small characterize but statistically highly likely a benign cyst. The remainder the liver is normal in contour without cirrhotic morphology. The parenchyma is diffusely low in attenuation suggesting steatosis. Gallbladder is unremarkable. No intra or extrahepatic biliary ductal dilatation. Pancreas: No mass, inflammatory changes, or other significant abnormality. Spleen: Within normal limits in size and appearance. Adrenals/Urinary Tract: No masses identified. No evidence of hydronephrosis. Stomach/Bowel: Diffuse distention of the colon with an abrupt transition in the sigmoid colon were there is some evidence of irregular wall thickening concerning for an underlying obstructing mass. The region is a amorphous and somewhat difficult to measure but is between 3.5 and 6.4 cm in length. The cecum is markedly dilated at 11.3 cm in diameter. There is subtle pneumatosis along the posterior wall of the cecum. Vascular/Lymphatic: No pathologically enlarged lymph nodes. No evidence of abdominal aortic aneurysm. Reproductive: No mass or other significant abnormality. Other: None. Musculoskeletal:  No suspicious bone lesions identified. IMPRESSION: 1. CT findings are concerning for distal colonic obstruction, likely  secondary to a subtle and ill-defined mass measuring between 3.5 and 6.4 cm (precise measurement is difficult given the indistinct margins of the lesion). Although there is no surrounding lymphadenopathy, colonic adenocarcinoma is of primary  concern. The cecum is markedly dilated measuring up to 11 cm. While there is no evidence of perforation at this time, there is subtle pneumatosis along the posterior wall of the cecum in the region of the ileocecal valve raising concern for impending perforation. Recommend surgical consultation. 2. Nonspecific 7 mm low-attenuation lesion in the superior aspect of hepatic segment 4A is too small to characterize but warrants attention on follow-up imaging. 3. Small hiatal hernia. 4. Hepatic steatosis. These results were called by telephone at the time of interpretation on 09/11/2015 at 8:17 am to Dr. Lynelle Doctor, who verbally acknowledged these results. Electronically Signed   By: Malachy Moan M.D.   On: 09/11/2015 08:17    Anti-infectives: Anti-infectives    Start     Dose/Rate Route Frequency Ordered Stop   09/11/15 2200  cefoTEtan (CEFOTAN) 2 g in dextrose 5 % 50 mL IVPB     2 g 100 mL/hr over 30 Minutes Intravenous Every 12 hours 09/11/15 2120 09/11/15 2247   09/11/15 1330  cefOXitin (MEFOXIN) 2 g in dextrose 5 % 50 mL IVPB    Comments:  Pharmacy may adjust dosing strength, interval, or rate of medication as needed for optimal therapy for the patient Send with patient on call to the OR.  Anesthesia to complete antibiotic administration <20min prior to incision per [facility].   2 g 100 mL/hr over 30 Minutes Intravenous On call to O.R. 09/11/15 0935 09/11/15 1332      Assessment/Plan: s/p Procedure(s): COLON RESECTION (N/A) EXPLORATORY LAPAROTOMY COLOSTOMY (N/A) Overall doing well this AM.  Will continue with CLD this AM. PCA for pain control. Ostomy consult for new ileostomy.  Will monitor output. DC abx. Encourage ambulation and OOB with IS use.  LOS: 1 day    Concha Se 09/12/2015

## 2015-09-12 NOTE — Consult Note (Signed)
Jimmy Pearson Medical Consultation  [name] [name]:[id number] DOB: 1950/11/30 DOA: 09/11/2015 PCP: No PCP Per Jimmy Pearson   Requesting physician: [facility]- Stevan Born, MD Date of consultation:  09/12/15 Reason for consultation: Hypoxia   Impression/Recommendations  Hypoxia. 02 sats fell into 80's last night. No chest pain. Jimmy Pearson sometimes gets SOB at night (wakes him up). No associated chest pain, palpitations or cough.  -Suspect he has sleep apnea which needs outpatient evaluation. -I Decreased IVF to 75ml /hr,  he is + 5250 ml fluid balance . Also, removed   potassium from IVF as K+ 5.1 today.  -Continuous 02 monitoring at night -Continue 02 2 liters per [place] prn 02 sats < 92% -CXR ordered -Limit narcotic - Jimmy Pearson hasn't been using much of the PCA anyway.  -Encourage use of incentive spirometry.   Hypertension, suboptimally controlled at present. DBP upper 90s since admission but may be secondary to pain / anxiety. -continue home Norvasc -Lotensin was restarted this am.    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 Please contact me if I can be of assistance in the meanwhile. Thank you for this consultation.  Chief Complaint: night time SOB  HPI:  Jimmy Pearson is a 65 yo retired male who recently relocated to [place] to be with friends. Approx 3 years ago Jimmy Pearson began having episodes of abdominal pain. Treated with antibiotics for presumed diverticulitis his symptoms always improved. Several weeks ago Jimmy Pearson began having problems with constipation, abdominal distention, nausea and diffuse abdominal pain. Jimmy Pearson thought symptoms were likely recurrent diverticulitis but had not established care with a PCP in [place]. Jimmy Pearson's symptoms progressed and he presented to ED. CTscan revealed obstructing distal colonic mass. Jimmy Pearson underwent subtotal colectomy with ileostomy yesterday.   Jimmy Pearson described  occasional SOB which mainly occurs at night. Sometimes Jimmy Pearson awakes in the middle of the night with dyspnea. Usually after standing up for a minute he is able to catch his breath. Episodes occur at most one a month. No associated chest pain or palpitations. Jimmy Pearson thinks he has been told he may have sleep apnea. Until recently Jimmy Pearson was generally waking up feeling rested, no significant headaches. No history of smoking.   Review of Systems:  Constitutional: Denies fever, chills, diaphoresis, appetite change, + recent fatigue  HEENT: Denies photophobia, eye pain, redness, hearing loss, ear pain, congestion, sore throat, rhinorrhea, sneezing, mouth sores, trouble swallowing, neck pain, neck stiffness and tinnitus.  Respiratory: + SOB, no DOE, cough, chest tightness, and wheezing.  Cardiovascular: Denies chest pain, palpitations and leg swelling.  Gastrointestinal: Denies vomiting, diarrhea, blood in stool .  Genitourinary: Denies dysuria, urgency, frequency, hematuria, flank pain and difficulty urinating.  Musculoskeletal: Denies myalgias, back pain, joint swelling, arthralgias and gait problem.  Skin: Denies pallor, rash and wound.  Neurological: Denies dizziness, seizures, syncope, weakness, light-headedness, numbness and headaches.  Hematological: Denies adenopathy. Easy bruising, personal or family bleeding history  Psychiatric/Behavioral: Denies suicidal ideation, mood changes, confusion, nervousness, sleep disturbance and agitation    Past Medical History  Diagnosis Date  . Diverticulitis   . Hypertension    Past Surgical History  Procedure Laterality Date  . Hernia repair     Social History:  reports that he has never smoked. He does not have any smokeless tobacco history on file. He reports that he drinks alcohol. He reports that he does not use illicit drugs.  No Known Allergies Family History  Problem Relation Age of Onset  .  Colon cancer Father     Prior to  Admission medications   Medication Sig Start Date End Date Taking? Authorizing Provider  amLODipine (NORVASC) 5 MG tablet Take 5 mg by mouth daily.   Yes Historical Provider, MD  benazepril (LOTENSIN) 20 MG tablet Take 20 mg by mouth every evening.   Yes Historical Provider, MD  finasteride (PROSCAR) 5 MG tablet Take 5 mg by mouth daily.   Yes Historical Provider, MD  ciprofloxacin (CIPRO) 500 MG tablet Take 1 tablet (500 mg total) by mouth 2 (two) times daily. Jimmy Pearson not taking: Reported on 09/11/2015 02/12/15   Theda Belfasthris Tegeler, MD  HYDROcodone-acetaminophen (NORCO/VICODIN) 5-325 MG tablet Take 2 tablets by mouth every 4 (four) hours as needed for severe pain. Jimmy Pearson not taking: Reported on 09/11/2015 02/12/15   Theda Belfasthris Tegeler, MD  metroNIDAZOLE (FLAGYL) 500 MG tablet Take 1 tablet (500 mg total) by mouth 3 (three) times daily. Jimmy Pearson not taking: Reported on 09/11/2015 02/12/15   Theda Belfasthris Tegeler, MD   Physical Exam: Blood pressure 114/98, pulse 66, temperature 98.3 F (36.8 C), temperature source Oral, resp. rate 18, height 5\' 8"  (1.727 m), weight 104.9 kg (231 lb 4.2 oz), SpO2 93 %. Filed Vitals:   09/12/15 0837 09/12/15 1141  BP:  114/98  Pulse:  66  Temp: 97 F (36.1 C) 98.3 F (36.8 C)  Resp:  18    Constitutional: Vital signs reviewed. Jimmy Pearson is a well-developed in no acute distress and cooperative with exam. Alert and oriented x3.  Head: Normocephalic and atraumatic  Ear: Normal hearing, n  Mouth: no erythema or exudates, MMM  Eyes: PER, EOMI, conjunctivae normal, No scleral icterus.  Neck: Supple, Trachea midline normal ROM, no mass.  Cardiovascular: RRR, S1 normal, S2 normal, no MRG, pulses symmetric and intact bilaterally  Pulmonary/Chest: CTAB, no wheezes, rales, or rhonchi  Abdominal: Soft. Mildly distended, ileostomy in RLQ. Midline surgical exam dressed. Minimal bowel sounds Musculoskeletal: No joint deformities, erythema, or stiffness, ROM full and no nontender Ext: no edema  and no cyanosis, pulses palpable bilaterally (DP and PT)  Hematology: no cervical adenopathy.  Neurological: A&O x3, Strenght is normal and symmetric bilaterally, cranial nerve II-XII are grossly intact, no focal motor deficit, sensory intact to light touch bilaterally.  Skin: Warm, dry and intact. No rash, cyanosis, or clubbing.  Psychiatric: Normal mood and affect. speech and behavior is normal. Judgment and thought content normal. Cognition and memory are normal.   Labs on Admission:  Basic Metabolic Panel:  Recent Labs Lab 09/11/15 0532 09/12/15 0440  NA 135 142  K 3.9 5.1  CL 99* 100*  CO2 24 31  GLUCOSE 127* 128*  BUN 12 10  CREATININE 0.74 0.89  CALCIUM 9.4 8.7*   Liver Function Tests:  Recent Labs Lab 09/11/15 0532 09/12/15 0440  AST 16 22  ALT 20 21  ALKPHOS 56 38  BILITOT 0.8 1.0  PROT 7.2 5.8*  ALBUMIN 4.1 3.4*    Recent Labs Lab 09/11/15 0532  LIPASE 31    CBC:  Recent Labs Lab 09/11/15 0532 09/12/15 0440  WBC 6.7 9.9  NEUTROABS 5.3  --   HGB 15.6 14.5  HCT 47.7 45.2  MCV 92.6 93.6  PLT 174 160    Recent Labs Lab 09/11/15 1323  GLUCAP 121*    Radiological Exams on Admission: Ct Abdomen Pelvis W Contrast  09/11/2015  CLINICAL DATA:  65 year old male with diffuse lower abdominal pain, nausea and constipation. Past history of diverticulitis. EXAM: CT ABDOMEN AND  PELVIS WITH CONTRAST TECHNIQUE: Multidetector CT imaging of the abdomen and pelvis was performed using the standard protocol following bolus administration of intravenous contrast. CONTRAST:  ISOVUE-300 IOPAMIDOL (ISOVUE-300) INJECTION 61% COMPARISON:  None. FINDINGS: Lower chest:  No acute findings.  Small hiatal hernia. Hepatobiliary: Small 7 mm circumscribed low-attenuation lesion in the superior aspect of hepatic segment 4A is too small characterize but statistically highly likely a benign cyst. The remainder the liver is normal in contour without cirrhotic morphology. The  parenchyma is diffusely low in attenuation suggesting steatosis. Gallbladder is unremarkable. No intra or extrahepatic biliary ductal dilatation. Pancreas: No mass, inflammatory changes, or other significant abnormality. Spleen: Within normal limits in size and appearance. Adrenals/Urinary Tract: No masses identified. No evidence of hydronephrosis. Stomach/Bowel: Diffuse distention of the colon with an abrupt transition in the sigmoid colon were there is some evidence of irregular wall thickening concerning for an underlying obstructing mass. The region is a amorphous and somewhat difficult to measure but is between 3.5 and 6.4 cm in length. The cecum is markedly dilated at 11.3 cm in diameter. There is subtle pneumatosis along the posterior wall of the cecum. Vascular/Lymphatic: No pathologically enlarged lymph nodes. No evidence of abdominal aortic aneurysm. Reproductive: No mass or other significant abnormality. Other: None. Musculoskeletal:  No suspicious bone lesions identified. IMPRESSION: 1. CT findings are concerning for distal colonic obstruction, likely secondary to a subtle and ill-defined mass measuring between 3.5 and 6.4 cm (precise measurement is difficult given the indistinct margins of the lesion). 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 The cecum is markedly dilated measuring up to 11 cm. While there is no evidence of perforation at this time, there is subtle pneumatosis along the posterior wall of the cecum in the region of the ileocecal valve raising concern for impending perforation. Recommend surgical consultation. 2. Nonspecific 7 mm low-attenuation lesion in the superior aspect of hepatic segment 4A is too small to characterize but warrants attention on follow-up imaging. 3. Small hiatal hernia. 4. Hepatic steatosis. These results were called by telephone at the time of interpretation on 09/11/2015 at 8:17 am to Dr. Lynelle Doctor, who verbally  acknowledged these results. Electronically Signed   By: Malachy Moan M.D.   On: 09/11/2015 08:17   Time spent: 45 minutes  Willette Cluster NP Jimmy Pearson Pager [phone]  If 7PM-7AM, please contact night-coverage [email or web address] Password [email or web address] 09/12/2015, 11:48 AM

## 2015-09-12 NOTE — Progress Notes (Signed)
Patient refused cpap for tonight. Patient stated he may want to try it tomorrow night but did not want to tonight. RT informed patient to have nurse call RT if he changes his mind. Patient's vitals are stable at this time and patient is not in distress.

## 2015-09-13 ENCOUNTER — Inpatient Hospital Stay (HOSPITAL_COMMUNITY): Payer: Medicare Other

## 2015-09-13 DIAGNOSIS — I509 Heart failure, unspecified: Secondary | ICD-10-CM

## 2015-09-13 DIAGNOSIS — E662 Morbid (severe) obesity with alveolar hypoventilation: Secondary | ICD-10-CM

## 2015-09-13 DIAGNOSIS — G4733 Obstructive sleep apnea (adult) (pediatric): Secondary | ICD-10-CM

## 2015-09-13 DIAGNOSIS — J9621 Acute and chronic respiratory failure with hypoxia: Secondary | ICD-10-CM

## 2015-09-13 DIAGNOSIS — R0982 Postnasal drip: Secondary | ICD-10-CM

## 2015-09-13 DIAGNOSIS — J9622 Acute and chronic respiratory failure with hypercapnia: Secondary | ICD-10-CM

## 2015-09-13 LAB — BLOOD GAS, ARTERIAL
Acid-Base Excess: 5.1 mmol/L — ABNORMAL HIGH (ref 0.0–2.0)
Bicarbonate: 29.5 mEq/L — ABNORMAL HIGH (ref 20.0–24.0)
Drawn by: 21338
FIO2: 0.21
O2 Saturation: 87.1 %
Patient temperature: 98.6
TCO2: 30.9 mmol/L (ref 0–100)
pCO2 arterial: 46.6 mmHg — ABNORMAL HIGH (ref 35.0–45.0)
pH, Arterial: 7.418 (ref 7.350–7.450)
pO2, Arterial: 50.8 mmHg — ABNORMAL LOW (ref 80.0–100.0)

## 2015-09-13 LAB — ECHOCARDIOGRAM COMPLETE
Height: 68 in
Weight: 3700.2 oz

## 2015-09-13 LAB — GLUCOSE, CAPILLARY: Glucose-Capillary: 91 mg/dL (ref 65–99)

## 2015-09-13 LAB — BRAIN NATRIURETIC PEPTIDE: B Natriuretic Peptide: 76.9 pg/mL (ref 0.0–100.0)

## 2015-09-13 MED ORDER — LORATADINE 10 MG PO TABS
10.0000 mg | ORAL_TABLET | Freq: Every day | ORAL | Status: DC
  Administered 2015-09-14 – 2015-09-16 (×3): 10 mg via ORAL
  Filled 2015-09-13 (×3): qty 1

## 2015-09-13 MED ORDER — FLUTICASONE PROPIONATE 50 MCG/ACT NA SUSP
2.0000 | Freq: Every day | NASAL | Status: DC
  Administered 2015-09-14 – 2015-09-16 (×3): 2 via NASAL
  Filled 2015-09-13 (×2): qty 16

## 2015-09-13 MED ORDER — MONTELUKAST SODIUM 10 MG PO TABS
10.0000 mg | ORAL_TABLET | Freq: Every day | ORAL | Status: DC
  Administered 2015-09-13 – 2015-09-15 (×3): 10 mg via ORAL
  Filled 2015-09-13 (×3): qty 1

## 2015-09-13 NOTE — Progress Notes (Signed)
PROGRESS NOTE    Jimmy Pearson  ZOX:[id number] DOB: [birth year] DOA: 09/11/2015 PCP: No PCP Per Patient   Outpatient Specialists:   Brief Narrative: 65 yo retired male who recently relocated to [place] to be with friends. Approx 3 years ago patient began having episodes of abdominal pain. Treated with antibiotics for presumed diverticulitis his symptoms always improved. Several weeks ago patient began having problems with constipation, abdominal distention, nausea and diffuse abdominal pain. Patient thought symptoms were likely recurrent diverticulitis but had not established care with a PCP in [place]. Patient's symptoms progressed and he presented to ED. CTscan revealed obstructing distal colonic mass. Patient has undergone subtotal colectomy with ileostomy.   Hospitalist team was consulted for hypoxia. Available documentation endorses occasional SOB, mainly at night time, severe enough to awaken patient in the middle of the night with dyspnea.Dyspnea with resolve with patient standing. Episodes occur at the most once a month.. No associated chest pain or palpitations.    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 Will check BNP and ABG on room air. Will get an ECHO and check cardiac BNP. CO2 is elevated, likely indicating chronic respiratory process. Suspect OSA/OHS. Continue Bipap at night. Assess for need for supplemental oxygen prior to discharge. Patient will  Need formal sleep studies when patient is back to baseline.  -Likely postnasal drip - Singulair, Zyrtec, flonase.  Hypertension- Optimize.  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 Consider lovenox when okay with surgical team.  Subjective: Reports postnasal drip symptoms.  Objective: Filed Vitals:   09/12/15  2332 09/13/15 0328 09/13/15 0754 09/13/15 1207  BP: 169/93 145/90 136/48 134/92  Pulse: 106 93 89   Temp: 98.3 F (36.8 C) 98.2 F (36.8 C) 98.1 F (36.7 C) 97.8 F (36.6 C)  TempSrc: Oral Oral Oral Oral  Resp: Height:      Weight:      SpO2: 92% 97% 98%     Intake/Output Summary (Last 24 hours) at 09/13/15 1341 Last data filed at 09/13/15 1146  Gross per 24 hour  Intake 1438.75 ml  Output   1150 ml  Net 288.75 ml   Filed Weights   09/11/15 0521 09/11/15 2055  Weight: 108.863 kg (240 lb) 104.9 kg (231 lb 4.2 oz)    Examination:  General exam: Obese. Appears calm and comfortable  Respiratory system: Clear to auscultation.   Cardiovascular system: S1 & S2   Gastrointestinal system: Obese, soft.  Central nervous system: Alert and oriented. No focal neurological deficits.   Data Reviewed: I have personally reviewed following labs and imaging studies  CBC:  Recent Labs Lab 09/11/15 0532 09/12/15 0440  WBC 6.7 9.9  NEUTROABS 5.3  --   HGB 15.6 14.5  HCT 47.7 45.2  MCV 92.6 93.6  PLT 174 160   Basic Metabolic Panel:  Recent Labs Lab 09/11/15 0532 09/12/15 0440  NA 135 142  K 3.9 5.1  CL 99* 100*  CO2 24 31  GLUCOSE 127* 128*  BUN 12 10  CREATININE 0.74 0.89  CALCIUM 9.4 8.7*   GFR: Estimated Creatinine Clearance: 101 mL/min (by C-G formula based on Cr of 0.89). Liver Function Tests:  Recent Labs Lab 09/11/15 0532 09/12/15 0440  AST 16 22  ALT 20 21  ALKPHOS 56 38  BILITOT 0.8 1.0  PROT 7.2 5.8*  ALBUMIN 4.1 3.4*    Recent Labs Lab 09/11/15 0532  LIPASE 31   No results for input(s): AMMONIA in the last 168 hours. Coagulation Profile: No results for input(s): INR, PROTIME in the last 168 hours. Cardiac Enzymes: No results for input(s): CKTOTAL, CKMB, CKMBINDEX, TROPONINI in the last 168 hours. BNP (last 3 results) No results for input(s): PROBNP in the last 8760 hours. HbA1C: No results for input(s): HGBA1C in the last 72  hours. CBG:  Recent Labs Lab 09/11/15 1323 09/13/15 0827  GLUCAP 121* 91   Lipid Profile: No results for input(s): CHOL, HDL, LDLCALC, TRIG, CHOLHDL, LDLDIRECT in the last 72 hours. Thyroid Function Tests: No results for input(s): TSH, T4TOTAL, FREET4, T3FREE, THYROIDAB in the last 72 hours. Anemia Panel: No results for input(s): VITAMINB12, FOLATE, FERRITIN, TIBC, IRON, RETICCTPCT in the last 72 hours. Urine analysis:    Component Value Date/Time   COLORURINE AMBER* 09/11/2015 0635   APPEARANCEUR CLOUDY* 09/11/2015 0635   LABSPEC 1.031* 09/11/2015 0635   PHURINE 5.5 09/11/2015 0635   GLUCOSEU NEGATIVE 09/11/2015 0635   HGBUR NEGATIVE 09/11/2015 0635   BILIRUBINUR SMALL* 09/11/2015 0635   KETONESUR 15* 09/11/2015 0635   PROTEINUR NEGATIVE 09/11/2015 0635   UROBILINOGEN 1.0 02/12/2015 1746   NITRITE NEGATIVE 09/11/2015 0635   LEUKOCYTESUR NEGATIVE 09/11/2015 0635   Sepsis Labs: @LABRCNTIP (procalcitonin:4,lacticidven:4)  ) Recent Results (from the past 240 hour(s))  MRSA PCR Screening     Status: None   Collection Time: 09/12/15  5:18 AM  Result Value Ref Range Status   MRSA by PCR NEGATIVE NEGATIVE Final    Comment:        The GeneXpert MRSA Assay (FDA approved for NASAL specimens only), is one component of a comprehensive MRSA colonization surveillance program. It is not intended to diagnose MRSA infection nor to guide or monitor treatment for MRSA infections.          Radiology Studies: Dg Chest Port 1 View  09/12/2015  CLINICAL DATA:  Extreme shortness of breath beginning this morning. Hypoxia. EXAM: PORTABLE CHEST 1 VIEW COMPARISON:  09/12/2015 FINDINGS: Heart and mediastinal contours are within normal limits. No focal opacities or effusions. No acute bony abnormality. IMPRESSION: No active disease. Electronically Signed   By: Charlett NoseKevin  Dover M.D.   On: 09/12/2015 12:03        Scheduled Meds: . amLODipine  5 mg Oral Daily  . benazepril  20 mg Oral  Daily  . finasteride  5 mg Oral Daily  . HYDROmorphone   Intravenous Q4H  . pantoprazole (PROTONIX) IV  40 mg Intravenous QHS   Continuous Infusions: . sodium chloride 75 mL/hr at 09/12/15 2100     LOS: 2 days    Time spent: 30 Mins    Barnetta ChapelSylvester I Roxsana Riding, MD [facility] Pager [phone]  If 7PM-7AM, please contact night-coverage [email or web address] Password [email or web address] 09/13/2015, 1:41 PM

## 2015-09-13 NOTE — Progress Notes (Signed)
  Echocardiogram 2D Echocardiogram has been performed.  Arvil ChacoFoster, Jennipher Weatherholtz 09/13/2015, 3:58 PM

## 2015-09-13 NOTE — Evaluation (Signed)
Physical Therapy Evaluation Patient Details Name: Jimmy OuLarry Pearson MRN: [id number] DOB: 10/20/1950 Today's Date: 09/13/2015   History of Present Illness  65 year old male with a history of diverticulitis, RIH repair with mesh and hypertension presents with persistent abdominal pain and is now s/p colectomy with end ileostomy for obstructing colon mass.  Clinical Impression  Patient demonstrates deficits in functional mobility as indicated below. Will need continued skilled PT to address deficits and maximize function. Will see as indicated and progress as tolerated.    Follow Up Recommendations No PT follow up;Supervision - Intermittent    Equipment Recommendations  None recommended by PT    Recommendations for Other Services       Precautions / Restrictions        Mobility  Bed Mobility Overal bed mobility: Modified Independent             General bed mobility comments: HOB elevated, increased time to perform. no physical assist required  Transfers Overall transfer level: Modified independent Equipment used: None             General transfer comment: increased time to perform, no physical assist required  Ambulation/Gait Ambulation/Gait assistance: Min guard Ambulation Distance (Feet): 200 Feet Assistive device: None Gait Pattern/deviations: Step-through pattern Gait velocity: decreased initially, Cues for increased cadence Gait velocity interpretation: Below normal speed for age/gender General Gait Details:  (steady with ambulation, sats >90% on RA)  Stairs            Wheelchair Mobility    Modified Rankin (Stroke Patients Only)       Balance Overall balance assessment: No apparent balance deficits (not formally assessed)                                           Pertinent Vitals/Pain Pain Assessment: 0-10 Pain Score: 4  Pain Location: abdominal when moving Pain Descriptors / Indicators: Aching;Discomfort Pain  Intervention(s): Monitored during session;Repositioned;PCA encouraged    Home Living Family/patient expects to be discharged to:: Private residence Living Arrangements: Non-relatives/Friends Available Help at Discharge: Friend(s);Available PRN/intermittently Type of Home: Other(Comment) (staying in a hotel,supposed to move into a 3rd floor apt)                Prior Function Level of Independence: Independent               Hand Dominance   Dominant Hand: Right    Extremity/Trunk Assessment   Upper Extremity Assessment: Overall WFL for tasks assessed           Lower Extremity Assessment: Overall WFL for tasks assessed         Communication   Communication: No difficulties  Cognition Arousal/Alertness: Awake/alert Behavior During Therapy: WFL for tasks assessed/performed Overall Cognitive Status: Within Functional Limits for tasks assessed                      General Comments      Exercises        Assessment/Plan    PT Assessment Patient needs continued PT services  PT Diagnosis Difficulty walking   PT Problem List Decreased strength;Decreased activity tolerance;Decreased balance;Decreased mobility;Cardiopulmonary status limiting activity;Obesity  PT Treatment Interventions Gait training;Stair training;Functional mobility training;Therapeutic activities;Therapeutic exercise;Balance training;Patient/family education   PT Goals (Current goals can be found in the Care Plan section) Acute Rehab PT Goals Patient Stated Goal: to get  better PT Goal Formulation: With patient Time For Goal Achievement: 09/27/15 Potential to Achieve Goals: Good    Frequency Min 3X/week   Barriers to discharge Inaccessible home environment possibly moving to 3rd floor walk up    Co-evaluation               End of Session Equipment Utilized During Treatment: Oxygen Activity Tolerance: Patient tolerated treatment well Patient left: in bed;with call  bell/phone within reach;Other (comment) (RT in room for ABG draw) Nurse Communication: Mobility status         Time: 1610-9604 PT Time Calculation (min) (ACUTE ONLY): 18 min   Charges:   PT Evaluation $PT Eval Moderate Complexity: 1 Procedure     PT G CodesFabio Asa 06-Oct-2015, 3:43 PM Charlotte Crumb, PT DPT  [phone]
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
## 2015-09-14 ENCOUNTER — Inpatient Hospital Stay (HOSPITAL_COMMUNITY): Payer: Medicare Other

## 2015-09-14 LAB — COMPREHENSIVE METABOLIC PANEL
ALT: 16 U/L — ABNORMAL LOW (ref 17–63)
AST: 19 U/L (ref 15–41)
Albumin: 3 g/dL — ABNORMAL LOW (ref 3.5–5.0)
Alkaline Phosphatase: 35 U/L — ABNORMAL LOW (ref 38–126)
Anion gap: 9 (ref 5–15)
BUN: 8 mg/dL (ref 6–20)
CHLORIDE: 97 mmol/L — AB (ref 101–111)
CO2: 32 mmol/L (ref 22–32)
Calcium: 8.8 mg/dL — ABNORMAL LOW (ref 8.9–10.3)
Creatinine, Ser: 0.64 mg/dL (ref 0.61–1.24)
Glucose, Bld: 104 mg/dL — ABNORMAL HIGH (ref 65–99)
POTASSIUM: 3.9 mmol/L (ref 3.5–5.1)
SODIUM: 138 mmol/L (ref 135–145)
Total Bilirubin: 0.9 mg/dL (ref 0.3–1.2)
Total Protein: 6.2 g/dL — ABNORMAL LOW (ref 6.5–8.1)

## 2015-09-14 LAB — CBC
HCT: 42.6 % (ref 39.0–52.0)
Hemoglobin: 13.6 g/dL (ref 13.0–17.0)
MCH: 30 pg (ref 26.0–34.0)
MCHC: 31.9 g/dL (ref 30.0–36.0)
MCV: 93.8 fL (ref 78.0–100.0)
PLATELETS: 176 10*3/uL (ref 150–400)
RBC: 4.54 MIL/uL (ref 4.22–5.81)
RDW: 13 % (ref 11.5–15.5)
WBC: 6.5 10*3/uL (ref 4.0–10.5)

## 2015-09-14 MED ORDER — IOPAMIDOL (ISOVUE-370) INJECTION 76%
INTRAVENOUS | Status: AC
  Administered 2015-09-14: 100 mL
  Filled 2015-09-14: qty 100

## 2015-09-14 NOTE — Procedures (Signed)
Late Entry - Pt does not wish to wear cpap, RT will continue to monitor.

## 2015-09-14 NOTE — Progress Notes (Signed)
PROGRESS NOTE    Jimmy OuLarry Pearson  ONG:[id number] DOB: 10/10/1950 DOA: 09/11/2015 PCP: No PCP Per Patient   Outpatient Specialists:   Brief Narrative: 65 yo retired male who recently relocated to [place] to be with friends. Approx 3 years ago patient began having episodes of abdominal pain. Treated with antibiotics for presumed diverticulitis his symptoms always improved. Several weeks ago patient began having problems with constipation, abdominal distention, nausea and diffuse abdominal pain. Patient thought symptoms were likely recurrent diverticulitis but had not established care with a PCP in [place]. Patient's symptoms progressed and he presented to ED. CTscan revealed obstructing distal colonic mass. Patient has undergone subtotal colectomy with ileostomy.   Hospitalist team was consulted for hypoxia. Available documentation endorses occasional SOB, mainly at night time, severe enough to awaken patient in the middle of the night with dyspnea.Dyspnea with resolve with patient standing. Episodes occur at the most once a month.. No associated chest pain or palpitations. Echo reveals diastolic dysfunction (Grade 1), normal EF. ABG reveals hypoxemia with minimally elevated PCO2. BNP is within normal range. Will proceed with CT Angio chest and, if non revealing, patient will need complete PFT.  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 See above. CT angio chest today and, if non revealing, patient will need complete PFT (Likely when optimized). Continue supplemental oxygen for now. ECHO revealed normal EF with Grade 1 diastolic dysfunction. ABG result is noted (PH of 7.418, PCO2 of 46.6 and PO2 of 50.8). -Likely postnasal drip - Singulair, Zyrtec, flonase.  Hypertension- Optimize.  Obstructing colon mass, s/p exp lap with subtotal colectomy and  ileostomy yesterday   DVT prophylaxis: SCD. Consider lovenox when okay with surgical team.  Subjective: Reports postnasal drip symptoms.  Objective: Filed Vitals:   09/14/15 0000 09/14/15 0340 09/14/15 0800 09/14/15 0802  BP:  144/97  127/81  Pulse:  72  62  Temp:  98.2 F (36.8 C)  97.8 F (36.6 C)  TempSrc:  Oral  Oral  Resp: 21 16 16 16   Height:      Weight:      SpO2: 96% 100% 100% 100%    Intake/Output Summary (Last 24 hours) at 09/14/15 0959 Last data filed at 09/14/15 0650  Gross per 24 hour  Intake   1575 ml  Output   1375 ml  Net    200 ml   Filed Weights   09/11/15 0521 09/11/15 2055  Weight: 108.863 kg (240 lb) 104.9 kg (231 lb 4.2 oz)    Examination:  General exam: Obese. Appears calm and comfortable  Respiratory system: Clear to auscultation.   Cardiovascular system: S1 & S2   Gastrointestinal system: Obese, soft.  Central nervous system: Alert and oriented. No focal neurological deficits.   Data Reviewed: I have personally reviewed following labs and imaging studies  CBC:  Recent Labs Lab 09/11/15 0532 09/12/15 0440 09/14/15 0331  WBC 6.7 9.9 6.5  NEUTROABS 5.3  --   --   HGB 15.6 14.5 13.6  HCT 47.7 45.2 42.6  MCV 92.6 93.6 93.8  PLT 174 160 176   Basic Metabolic Panel:  Recent Labs Lab 09/11/15 0532 09/12/15 0440 09/14/15 0331  NA 135 142 138  K 3.9 5.1 3.9  CL 99* 100* 97*  CO2 24 31 32  GLUCOSE 127* 128* 104*  BUN 12 10 8   CREATININE 0.74 0.89  0.64  CALCIUM 9.4 8.7* 8.8*   GFR: Estimated Creatinine Clearance: 112.4 mL/min (by C-G formula based on Cr of 0.64). Liver Function Tests:  Recent Labs Lab 09/11/15 0532 09/12/15 0440 09/14/15 0331  AST ALT 20 21 16*  ALKPHOS 56 38 35*  BILITOT 0.8 1.0 0.9  PROT 7.2 5.8* 6.2*  ALBUMIN 4.1 3.4* 3.0*    Recent Labs Lab 09/11/15 0532  LIPASE 31   No results for input(s): AMMONIA in the last 168 hours. Coagulation Profile: No results for input(s): INR,  PROTIME in the last 168 hours. Cardiac Enzymes: No results for input(s): CKTOTAL, CKMB, CKMBINDEX, TROPONINI in the last 168 hours. BNP (last 3 results) No results for input(s): PROBNP in the last 8760 hours. HbA1C: No results for input(s): HGBA1C in the last 72 hours. CBG:  Recent Labs Lab 09/11/15 1323 09/13/15 0827  GLUCAP 121* 91   Lipid Profile: No results for input(s): CHOL, HDL, LDLCALC, TRIG, CHOLHDL, LDLDIRECT in the last 72 hours. Thyroid Function Tests: No results for input(s): TSH, T4TOTAL, FREET4, T3FREE, THYROIDAB in the last 72 hours. Anemia Panel: No results for input(s): VITAMINB12, FOLATE, FERRITIN, TIBC, IRON, RETICCTPCT in the last 72 hours. Urine analysis:    Component Value Date/Time   COLORURINE AMBER* 09/11/2015 0635   APPEARANCEUR CLOUDY* 09/11/2015 0635   LABSPEC 1.031* 09/11/2015 0635   PHURINE 5.5 09/11/2015 0635   GLUCOSEU NEGATIVE 09/11/2015 0635   HGBUR NEGATIVE 09/11/2015 0635   BILIRUBINUR SMALL* 09/11/2015 0635   KETONESUR 15* 09/11/2015 0635   PROTEINUR NEGATIVE 09/11/2015 0635   UROBILINOGEN 1.0 02/12/2015 1746   NITRITE NEGATIVE 09/11/2015 0635   LEUKOCYTESUR NEGATIVE 09/11/2015 0635   Sepsis Labs: (procalcitonin:4,lacticidven:4)  ) Recent Results (from the past 240 hour(s))  MRSA PCR Screening     Status: None   Collection Time: 09/12/15  5:18 AM  Result Value Ref Range Status   MRSA by PCR NEGATIVE NEGATIVE Final    Comment:        The GeneXpert MRSA Assay (FDA approved for NASAL specimens only), is one component of a comprehensive MRSA colonization surveillance program. It is not intended to diagnose MRSA infection nor to guide or monitor treatment for MRSA infections.          Radiology Studies: Dg Chest Port 1 View  09/12/2015  CLINICAL DATA:  Extreme shortness of breath beginning this morning. Hypoxia. EXAM: PORTABLE CHEST 1 VIEW COMPARISON:  09/12/2015 FINDINGS: Heart and mediastinal contours are  within normal limits. No focal opacities or effusions. No acute bony abnormality. IMPRESSION: No active disease. Electronically Signed   By: Charlett Nose M.D.   On: 09/12/2015 12:03        Scheduled Meds: . amLODipine  5 mg Oral Daily  . benazepril  20 mg Oral Daily  . finasteride  5 mg Oral Daily  . fluticasone  2 spray Each Nare Daily  . HYDROmorphone   Intravenous Q4H  . loratadine  10 mg Oral Daily  . montelukast  10 mg Oral QHS  . pantoprazole (PROTONIX) IV  40 mg Intravenous QHS   Continuous Infusions: . sodium chloride 75 mL/hr at 09/14/15 0700     LOS: 3 days    Time spent: 30 Mins    Barnetta Chapel, MD [facility] Pager [phone]  If 7PM-7AM, please contact night-coverage [email or web address] Password [email or web address] 09/14/2015, 9:59 AM

## 2015-09-14 NOTE — Progress Notes (Signed)
3 Days Post-Op  Subjective: Doing well bloated and nausea   Objective: Vital signs in last 24 hours: Temp:  [97.5 F (36.4 C)-98.4 F (36.9 C)] 97.8 F (36.6 C) (05/07 0802) Pulse Rate:  [62-91] 62 (05/07 0802) Resp:  [15-22] 16 (05/07 0802) BP: (127-145)/(77-97) 127/81 mmHg (05/07 0802) SpO2:  [95 %-100 %] 100 % (05/07 0802)    Intake/Output from previous day: 05/06 0701 - 05/07 0700 In: 1575 [I.V.:1575] Out: 1375 [Urine:1325; Stool:50] Intake/Output this shift:    Resp: clear to auscultation bilaterally Cardio: regular rate and rhythm, S1, S2 normal, no murmur, click, rub or gallop Incision/Wound:ostomy pink viable   Distended quiet  Incision intact   Lab Results:   Recent Labs  09/12/15 0440 09/14/15 0331  WBC 9.9 6.5  HGB 14.5 13.6  HCT 45.2 42.6  PLT 160 176   BMET  Recent Labs  09/12/15 0440 09/14/15 0331  NA 142 138  K 5.1 3.9  CL 100* 97*  CO2 31 32  GLUCOSE 128* 104*  BUN 10 8  CREATININE 0.89 0.64  CALCIUM 8.7* 8.8*   PT/INR No results for input(s): LABPROT, INR in the last 72 hours. ABG  Recent Labs  09/13/15 1541  PHART 7.418  HCO3 29.5*    Studies/Results: Dg Chest Port 1 View  09/12/2015  CLINICAL DATA:  Extreme shortness of breath beginning this morning. Hypoxia. EXAM: PORTABLE CHEST 1 VIEW COMPARISON:  09/12/2015 FINDINGS: Heart and mediastinal contours are within normal limits. No focal opacities or effusions. No acute bony abnormality. IMPRESSION: No active disease. Electronically Signed   By: Charlett NoseKevin  Dover M.D.   On: 09/12/2015 12:03    Anti-infectives: Anti-infectives    Start     Dose/Rate Route Frequency Ordered Stop   09/11/15 2200  cefoTEtan (CEFOTAN) 2 g in dextrose 5 % 50 mL IVPB     2 g 100 mL/hr over 30 Minutes Intravenous Every 12 hours 09/11/15 2120 09/11/15 2247   09/11/15 1330  cefOXitin (MEFOXIN) 2 g in dextrose 5 % 50 mL IVPB    Comments:  Pharmacy may adjust dosing strength, interval, or rate of medication as  needed for optimal therapy for the patient Send with patient on call to the OR.  Anesthesia to complete antibiotic administration <7460min prior to incision per [facility].   2 g 100 mL/hr over 30 Minutes Intravenous On call to O.R. 09/11/15 0935 09/11/15 1332      Assessment/Plan: s/p Procedure(s): COLON RESECTION (N/A) EXPLORATORY LAPAROTOMY COLOSTOMY (N/A) Transfer to floor  Keep on clears until GI function returns   LOS: 3 days    Toshiko Kemler A. 09/14/2015

## 2015-09-14 NOTE — Progress Notes (Signed)
Placed patient on CPAP for the night with oxygen set at 3lpm.  

## 2015-09-15 DIAGNOSIS — R0902 Hypoxemia: Secondary | ICD-10-CM

## 2015-09-15 DIAGNOSIS — I10 Essential (primary) hypertension: Secondary | ICD-10-CM

## 2015-09-15 DIAGNOSIS — K6389 Other specified diseases of intestine: Secondary | ICD-10-CM

## 2015-09-15 DIAGNOSIS — K5660 Unspecified intestinal obstruction: Secondary | ICD-10-CM

## 2015-09-15 MED ORDER — PANTOPRAZOLE SODIUM 40 MG PO TBEC
40.0000 mg | DELAYED_RELEASE_TABLET | Freq: Every day | ORAL | Status: DC
  Administered 2015-09-15: 40 mg via ORAL
  Filled 2015-09-15: qty 1

## 2015-09-15 MED ORDER — ENOXAPARIN SODIUM 60 MG/0.6ML ~~LOC~~ SOLN
50.0000 mg | SUBCUTANEOUS | Status: DC
  Administered 2015-09-15: 50 mg via SUBCUTANEOUS
  Filled 2015-09-15: qty 0.6

## 2015-09-15 MED ORDER — HYDROMORPHONE HCL 1 MG/ML IJ SOLN: 1.0000 mg | INTRAMUSCULAR | Status: DC | PRN

## 2015-09-15 MED ORDER — OXYCODONE-ACETAMINOPHEN 5-325 MG PO TABS
1.0000 | ORAL_TABLET | ORAL | Status: DC | PRN
  Administered 2015-09-15 – 2015-09-16 (×5): 2 via ORAL
  Filled 2015-09-15 (×5): qty 2

## 2015-09-15 MED ORDER — SODIUM CHLORIDE 0.9% FLUSH: 3.0000 mL | INTRAVENOUS | Status: DC | PRN

## 2015-09-15 MED ORDER — SODIUM CHLORIDE 0.9% FLUSH
3.0000 mL | Freq: Two times a day (BID) | INTRAVENOUS | Status: DC
  Administered 2015-09-15: 3 mL via INTRAVENOUS

## 2015-09-15 NOTE — Progress Notes (Signed)
PROGRESS NOTE    Jimmy Pearson  ZOX:[id number] DOB: 11-05-1950 DOA: 09/11/2015 PCP: No PCP Per Patient   Outpatient Specialists:   Brief Narrative: 65 yo retired male who recently relocated to [place] to be with friends. Approx 3 years ago patient began having episodes of abdominal pain. Treated with antibiotics for presumed diverticulitis his symptoms always improved. Several weeks ago patient began having problems with constipation, abdominal distention, nausea and diffuse abdominal pain. Patient thought symptoms were likely recurrent diverticulitis but had not established care with a PCP in [place]. Patient's symptoms progressed and he presented to ED. CTscan revealed obstructing distal colonic mass. Patient has undergone subtotal colectomy with ileostomy.   Hospitalist team was consulted for hypoxia. Available documentation endorses occasional SOB, mainly at night time, severe enough to awaken patient in the middle of the night with dyspnea.Dyspnea with resolve with patient standing. Episodes occur at the most once a month.. No associated chest pain or palpitations. Echo reveals diastolic dysfunction (Grade 1), normal EF. ABG reveals hypoxemia with minimally elevated PCO2. BNP is within normal range.  CT Angio chest negative for PE.  patient will need complete PFT.  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  -complete PFT (as outpatient).  -outpatient sleep study - ECHO revealed normal EF with Grade 1 diastolic dysfunction. ABG result is noted (PH of 7.418, PCO2 of 46.6 and PO2 of 50.8). -Likely postnasal drip - Singulair, Zyrtec, flonase.  Hypertension- Optimize once pain controlled  Obstructing colon mass, s/p exp lap with subtotal colectomy and ileostomy yesterday  Will sign off, please re-consult if needed-- see recs above for  outpatient follow up   DVT prophylaxis: SCD. Consider lovenox when okay with surgical team.  Subjective: Mildly confused this AM-- asking about CT scans/MRIs  Objective: Filed Vitals:   09/15/15 0016 09/15/15 0400 09/15/15 0443 09/15/15 0829  BP:  156/86    Pulse:  72    Temp:  98 F (36.7 C)    TempSrc:  Oral    Resp: Height:      Weight:      SpO2:  99% 99% 96%    Intake/Output Summary (Last 24 hours) at 09/15/15 0914 Last data filed at 09/15/15 0700  Gross per 24 hour  Intake   1420 ml  Output   2675 ml  Net  -1255 ml   Filed Weights   09/11/15 0521 09/11/15 2055 09/14/15 1501  Weight: 108.863 kg (240 lb) 104.9 kg (231 lb 4.2 oz) 100 kg (220 lb 7.4 oz)    Examination:  General exam: Obese. Appears calm and comfortable  Respiratory system: Clear to auscultation.   Cardiovascular system: S1 & S2   Gastrointestinal system: Obese, soft.  Central nervous system: pleasant   Data Reviewed: I have personally reviewed following labs and imaging studies  CBC:  Recent Labs Lab 09/11/15 0532 09/12/15 0440 09/14/15 0331  WBC 6.7 9.9 6.5  NEUTROABS 5.3  --   --   HGB 15.6 14.5 13.6  HCT 47.7 45.2 42.6  MCV 92.6 93.6 93.8  PLT 174 160 176   Basic Metabolic Panel:  Recent Labs Lab 09/11/15 0532 09/12/15 0440 09/14/15 0331  NA 135 142 138  K 3.9 5.1 3.9  CL 99* 100* 97*  CO2 24 31 32  GLUCOSE 127* 128* 104*  BUN 12 10 8  CREATININE 0.74 0.89 0.64  CALCIUM 9.4 8.7* 8.8*   GFR: Estimated Creatinine Clearance: 109.7 mL/min (by C-G formula based on Cr of 0.64). Liver Function Tests:  Recent Labs Lab 09/11/15 0532 09/12/15 0440 09/14/15 0331  AST 16 22 19   ALT 20 21 16*  ALKPHOS 56 38 35*  BILITOT 0.8 1.0 0.9  PROT 7.2 5.8* 6.2*  ALBUMIN 4.1 3.4* 3.0*    Recent Labs Lab 09/11/15 0532  LIPASE 31   No results for input(s): AMMONIA in the last 168 hours. Coagulation Profile: No results for input(s): INR, PROTIME in the last 168  hours. Cardiac Enzymes: No results for input(s): CKTOTAL, CKMB, CKMBINDEX, TROPONINI in the last 168 hours. BNP (last 3 results) No results for input(s): PROBNP in the last 8760 hours. HbA1C: No results for input(s): HGBA1C in the last 72 hours. CBG:  Recent Labs Lab 09/11/15 1323 09/13/15 0827  GLUCAP 121* 91   Lipid Profile: No results for input(s): CHOL, HDL, LDLCALC, TRIG, CHOLHDL, LDLDIRECT in the last 72 hours. Thyroid Function Tests: No results for input(s): TSH, T4TOTAL, FREET4, T3FREE, THYROIDAB in the last 72 hours. Anemia Panel: No results for input(s): VITAMINB12, FOLATE, FERRITIN, TIBC, IRON, RETICCTPCT in the last 72 hours. Urine analysis:    Component Value Date/Time   COLORURINE AMBER* 09/11/2015 0635   APPEARANCEUR CLOUDY* 09/11/2015 0635   LABSPEC 1.031* 09/11/2015 0635   PHURINE 5.5 09/11/2015 0635   GLUCOSEU NEGATIVE 09/11/2015 0635   HGBUR NEGATIVE 09/11/2015 0635   BILIRUBINUR SMALL* 09/11/2015 0635   KETONESUR 15* 09/11/2015 0635   PROTEINUR NEGATIVE 09/11/2015 0635   UROBILINOGEN 1.0 02/12/2015 1746   NITRITE NEGATIVE 09/11/2015 0635   LEUKOCYTESUR NEGATIVE 09/11/2015 0635    Recent Results (from the past 240 hour(s))  MRSA PCR Screening     Status: None   Collection Time: 09/12/15  5:18 AM  Result Value Ref Range Status   MRSA by PCR NEGATIVE NEGATIVE Final    Comment:        The GeneXpert MRSA Assay (FDA approved for NASAL specimens only), is one component of a comprehensive MRSA colonization surveillance program. It is not intended to diagnose MRSA infection nor to guide or monitor treatment for MRSA infections.          Radiology Studies: Ct Angio Chest Pe W/cm &/or Wo Cm  09/14/2015  CLINICAL DATA:  Status post subtotal colectomy for a distal colonic mass. Shortness of breath. Evaluate for pulmonary embolus. EXAM: CT ANGIOGRAPHY CHEST WITH CONTRAST TECHNIQUE: Multidetector CT imaging of the chest was performed using the  standard protocol during bolus administration of intravenous contrast. Multiplanar CT image reconstructions and MIPs were obtained to evaluate the vascular anatomy. CONTRAST:  90 mL of Isovue 370 COMPARISON:  Chest x-ray Sep 12, 2015 FINDINGS: The central airways are normal. No pneumothorax. There is mild dependent atelectasis, greater on the left than the right. No suspicious nodules, masses, or infiltrates. The ascending thoracic aorta measures 4.3 cm which is slightly prominent. No dissection or other aortic abnormality. The heart size is borderline. No effusions. There is a prominent lymph node in the superior mediastinum to the left measuring 12 x 30 mm. No other enlarged lymph nodes are seen in the chest. No pulmonary emboli. There is a small amount of fluid around the superior spleen which could be postop given history. High attenuation in the gallbladder could represent sludge. Limited views of the upper abdomen are otherwise normal. No acute bony abnormalities. Review of the MIP images confirms  the above findings. IMPRESSION: 1. No pulmonary emboli. 2. The ascending thoracic aorta measures 4.3 cm which is slightly prominent. 3. There is a lymph node in the superior mediastinum measuring 13 x 30 mm. This is nonspecific. No other adenopathy. 4. There is a small amount of fluid adjacent to the spleen, possibly postoperative given reported history. Electronically Signed   By: Gerome Samavid  Williams III M.D   On: 09/14/2015 11:27        Scheduled Meds: . amLODipine  5 mg Oral Daily  . benazepril  20 mg Oral Daily  . finasteride  5 mg Oral Daily  . fluticasone  2 spray Each Nare Daily  . loratadine  10 mg Oral Daily  . montelukast  10 mg Oral QHS  . pantoprazole (PROTONIX) IV  40 mg Intravenous QHS  . sodium chloride flush  3 mL Intravenous Q12H   Continuous Infusions:     LOS: 4 days    Time spent: 25 Mins    JESSICA Juanetta GoslingU VANN, DO [facility] Pager [phone]  If 7PM-7AM, please  contact night-coverage [email or web address] Password [email or web address] 09/15/2015, 9:14 AM

## 2015-09-15 NOTE — Progress Notes (Signed)
Patient ID: Jimmy Pearson, male   DOB: Mar 11, 1951, 65 y.o.   MRN: [id number]     [address] [place], [place] [address]    Phone: [phone] FAX: [phone]     Subjective: Ostomy functioning.  Wants to only take in clears, discussed importance of advancing diet to consume enough protein for wound healing.  Discussed nature of ileostomy, being high output.  Pain controlled.  Ambulating. Up in chair.  tolerating clears.  No n/v.  Afebrile.  VSS.    Objective:  Vital signs:  Filed Vitals:   09/15/15 0016 09/15/15 0400 09/15/15 0443 09/15/15 0829  BP:  156/86    Pulse:  72    Temp:  98 F (36.7 C)    TempSrc:  Oral    Resp: _0 Height:      Weight:      SpO2:  99% 99% 96%    Last BM Date: 09/11/15 (pta)  Intake/Output   Yesterday:  05/07 0701 - 05/08 0700 In: 0017 [P.O.:820; I.V.:600] Out: 2675 [Urine:2675] This shift: I/O last 3 completed shifts: In: 2395 [P.O.:820; I.V.:1575] Out: 4944 [Urine:3800; Stool:50]    Physical Exam: General: Pt awake/alert/oriented x4 in no acute distress Chest: cta.  No chest wall pain w good excursion CV:  Pulses intact.  Regular rhythm Abdomen: Soft.  Nondistended.  Mildly tender at incisions only.  Dressing removed, staples in place without erythema. Stoma is pink and viable, stool and air present. No evidence of peritonitis.  No incarcerated hernias. Ext:  SCDs BLE.  No mjr edema.  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   Colon obstruction ([place])   Colonic mass   Hypoxia    Results:   Labs: Results for orders placed or performed during the hospital encounter of 09/11/15 (from the past 48 hour(s))  Blood gas, arterial     Status: Abnormal   Collection Time: 09/13/15  3:41 PM  Result Value Ref Range   FIO2 0.21    pH, Arterial 7.418 7.350 - 7.450    pCO2 arterial 46.6 (H) 35.0 - 45.0 mmHg   pO2, Arterial 50.8 (L) 80.0 - 100.0 mmHg   Bicarbonate 29.5 (H) 20.0 - 24.0 mEq/L   TCO2 30.9 0 - 100 mmol/L   Acid-Base Excess 5.1 (H) 0.0 - 2.0 mmol/L   O2 Saturation 87.1 %   Patient temperature 98.6    Collection site RIGHT RADIAL    Drawn by [id number]    Sample type ARTERIAL DRAW    Allens test (pass/fail) PASS PASS    Comment: Performed at [facility]  Brain natriuretic peptide     Status: None   Collection Time: 09/13/15  4:54 PM  Result Value Ref Range   B Natriuretic Peptide 76.9 0.0 - 100.0 pg/mL  CBC     Status: None   Collection Time: 09/14/15  3:31 AM  Result Value Ref Range   WBC 6.5 4.0 - 10.5 K/uL   RBC 4.54 4.22 - 5.81 MIL/uL   Hemoglobin 13.6 13.0 - 17.0 g/dL   HCT 42.6 39.0 - 52.0 %   MCV 93.8 78.0 - 100.0 fL   MCH 30.0 26.0 - 34.0 pg   MCHC 31.9 30.0 - 36.0 g/dL   RDW 13.0 11.5 - 15.5 %   Platelets 176 150 -  400 K/uL  Comprehensive metabolic panel     Status: Abnormal   Collection Time: 09/14/15  3:31 AM  Result Value Ref Range   Sodium 138 135 - 145 mmol/L   Potassium 3.9 3.5 - 5.1 mmol/L    Comment: DELTA CHECK NOTED   Chloride 97 (L) 101 - 111 mmol/L   CO2 32 22 - 32 mmol/L   Glucose, Bld 104 (H) 65 - 99 mg/dL   BUN 8 6 - 20 mg/dL   Creatinine, Ser 0.64 0.61 - 1.24 mg/dL   Calcium 8.8 (L) 8.9 - 10.3 mg/dL   Total Protein 6.2 (L) 6.5 - 8.1 g/dL   Albumin 3.0 (L) 3.5 - 5.0 g/dL   AST 19 15 - 41 U/L   ALT 16 (L) 17 - 63 U/L   Alkaline Phosphatase 35 (L) 38 - 126 U/L   Total Bilirubin 0.9 0.3 - 1.2 mg/dL   GFR calc non Af Amer >60 >60 mL/min   GFR calc Af Amer >60 >60 mL/min    Comment: (NOTE) The eGFR has been calculated using the CKD EPI equation. This calculation has not been validated in all clinical situations. eGFR's persistently <60 mL/min signify possible Chronic Kidney Disease.    Anion gap 9 5 - 15    Imaging / Studies: Ct Angio Chest Pe W/cm &/or Wo Cm  09/14/2015   CLINICAL DATA:  Status post subtotal colectomy for a distal colonic mass. Shortness of breath. Evaluate for pulmonary embolus. EXAM: CT ANGIOGRAPHY CHEST WITH CONTRAST TECHNIQUE: Multidetector CT imaging of the chest was performed using the standard protocol during bolus administration of intravenous contrast. Multiplanar CT image reconstructions and MIPs were obtained to evaluate the vascular anatomy. CONTRAST:  90 mL of Isovue 370 COMPARISON:  Chest x-ray Sep 12, 2015 FINDINGS: The central airways are normal. No pneumothorax. There is mild dependent atelectasis, greater on the left than the right. No suspicious nodules, masses, or infiltrates. The ascending thoracic aorta measures 4.3 cm which is slightly prominent. No dissection or other aortic abnormality. The heart size is borderline. No effusions. There is a prominent lymph node in the superior mediastinum to the left measuring 12 x 30 mm. No other enlarged lymph nodes are seen in the chest. No pulmonary emboli. There is a small amount of fluid around the superior spleen which could be postop given history. High attenuation in the gallbladder could represent sludge. Limited views of the upper abdomen are otherwise normal. No acute bony abnormalities. Review of the MIP images confirms the above findings. IMPRESSION: 1. No pulmonary emboli. 2. The ascending thoracic aorta measures 4.3 cm which is slightly prominent. 3. There is a lymph node in the superior mediastinum measuring 13 x 30 mm. This is nonspecific. No other adenopathy. 4. There is a small amount of fluid adjacent to the spleen, possibly postoperative given reported history. Electronically Signed   By: Dorise Bullion III M.D   On: 09/14/2015 11:27    Medications / Allergies:  Scheduled Meds: . amLODipine  5 mg Oral Daily  . benazepril  20 mg Oral Daily  . finasteride  5 mg Oral Daily  . fluticasone  2 spray Each Nare Daily  . loratadine  10 mg Oral Daily  . montelukast  10 mg Oral QHS  .  pantoprazole (PROTONIX) IV  40 mg Intravenous QHS  . sodium chloride flush  3 mL Intravenous Q12H   Continuous Infusions:  PRN Meds:.hydrALAZINE, HYDROmorphone (DILAUDID) injection, methocarbamol, oxyCODONE-acetaminophen, sodium chloride flush  Antibiotics:  Anti-infectives    Start     Dose/Rate Route Frequency Ordered Stop   09/11/15 2200  cefoTEtan (CEFOTAN) 2 g in dextrose 5 % 50 mL IVPB     2 g 100 mL/hr over 30 Minutes Intravenous Every 12 hours 09/11/15 2120 09/11/15 2247   09/11/15 1330  cefOXitin (MEFOXIN) 2 g in dextrose 5 % 50 mL IVPB    Comments:  Pharmacy may adjust dosing strength, interval, or rate of medication as needed for optimal therapy for the patient Send with patient on call to the OR.  Anesthesia to complete antibiotic administration <77mn prior to [facility]   2 g 100 mL/hr over 30 Minutes Intravenous On call to O.R. 09/11/15 0935 09/11/15 1332        Assessment/Plan Sigmoid colon obstruction POD#4 exploratory laparotomy, subtotal colectomy, ileostomy---Dr. TGeorgette Dover-advance to fulls, DC PCA and add PO/IV pain meds -mobilize, PT eval, IS -woc consult for teaching, may be challenging with patient's intellectual level and ability to listen  FEN-advance diet, SLIV Hypoxia-appreciate medicine follow up.  Needs OP sleep study HTN-home meds VTE prophylaxis-add lovenox, SCDs Dispo-ileus  EErby Pian ANP-BC [facility] Pager [phone](7A-4:30P) For consults and floor pages call [phone](7A-4:30P)  09/15/2015 9:20 AM

## 2015-09-15 NOTE — Care Management Note (Signed)
Case Management Note  Patient Details  Name: Radene OuLarry Jemison MRN: [id number] Date of Birth: 03/15/1951  Subjective/Objective:                    Action/Plan:   Expected Discharge Date:                  Expected Discharge Plan:  Home/Self Care  In-House Referral:     Discharge planning Services  CM Consult  Post Acute Care Choice:    Choice offered to:     DME Arranged:    DME Agency:     HH Arranged:    HH Agency:     Status of Service:  In process, will continue to follow  Medicare Important Message Given:    Date Medicare IM Given:    Medicare IM give by:    Date Additional Medicare IM Given:    Additional Medicare Important Message give by:     If discussed at Long Length of Stay Meetings, dates discussed:    Additional Comments:  Kingsley PlanWile, Jed Kutch Marie, RN 09/15/2015, 11:16 AM

## 2015-09-15 NOTE — Consult Note (Signed)
WOC ostomy follow up Stoma type/location: Ileostomy stoma red and viable to RLQ Stomal assessment/size: 1 1/4 inches, above skin level Peristomal assessment:  Intact skin surrounding Output: Small amt pink drainage, no stool or flatus  Ostomy pouching: Pt prefers to use one piece upon discharge, 2 piece applied today.  Education provided:  Demonstrated pouch change.  Pt stood and looked in the mirror and assisted with application.  He is able to open and close the velcro to empty.  Discussed pouching routines and ordering supplies.  Pt asked appropriate questions. Supplies ordered to room for staff nurse use [place]rolled [place] in [facility] Discharge program: No Cammie Mcgeeawn Davier Tramell MSN, RN, [place], [place], [place] [phone]

## 2015-09-15 NOTE — Progress Notes (Signed)
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    PT Comments    Pt pleasant and willing to work with therapy. Pt able to mobilize without physical assistance. Educated pt on using step-to pattern on stairs. Pt with safe mobility for home, with all education completed. Will sign off, with pt aware and agreeable.  Follow Up Recommendations  No PT follow up;Supervision - Intermittent     Equipment Recommendations       Recommendations for Other Services       Precautions / Restrictions      Mobility  Bed Mobility Overal bed mobility: Modified Independent             General bed mobility comments: increased time to perform with use of rail. no physical assistsance required.  Transfers Overall transfer level: Modified independent                  Ambulation/Gait Ambulation/Gait assistance: Supervision Ambulation Distance (Feet): 500 Feet Assistive device: None Gait Pattern/deviations: Step-through pattern   Gait velocity interpretation: Below normal speed for age/gender General Gait Details: steady. cues to relax bil UEs because pt maintains elbow flexion during ambulation   Stairs Stairs: Yes Stairs assistance: Supervision Stair Management: One rail Right;One rail Left;Step to pattern;Forwards Number of Stairs: 6 General stair comments: educated pt for step-to pattern with stairs. x2 trials with using left and right rail  Wheelchair Mobility    Modified Rankin (Stroke Patients Only)       Balance Overall balance assessment: No apparent balance deficits (not formally assessed)                                  Cognition  Arousal/Alertness: Awake/alert Behavior During Therapy: WFL for tasks assessed/performed Overall Cognitive Status: Within Functional Limits for tasks assessed                      Exercises      General Comments        Pertinent Vitals/Pain Pain Assessment: 0-10 Pain Score: 3  Pain Location: abdomen Pain Descriptors / Indicators: Dull Pain Intervention(s): PCA encouraged;Monitored during session;Limited activity within patient's tolerance    Home Living                      Prior Function            PT Goals (current goals can now be found in the care plan section) Progress towards PT goals: Goals met/education completed, patient discharged from PT    Frequency       PT Plan Current plan remains appropriate    Co-evaluation             End of Session Equipment Utilized During Treatment: Gait belt Activity Tolerance: Patient tolerated treatment well Patient left: in chair;with call bell/phone within reach     Time: 9476-5465 PT Time Calculation (min) (ACUTE ONLY): 30 min  Charges:  $Gait Training: 23-37 mins                    G Codes:      Jimmy Pearson 09/25/2015,  8:53 AM   Jimmy Pearson, SPT [phone]

## 2015-09-15 NOTE — Progress Notes (Signed)
Patient refused CPAP for tonight, stating he "did not do well with it last night", and that it felt bulky on him.  Patient requested unit be removed from room.

## 2015-09-16 MED ORDER — OXYCODONE-ACETAMINOPHEN 5-325 MG PO TABS: 1.0000 | ORAL_TABLET | ORAL | Status: DC | PRN

## 2015-09-16 MED ORDER — MONTELUKAST SODIUM 10 MG PO TABS: 10.0000 mg | ORAL_TABLET | Freq: Every day | ORAL | Status: AC

## 2015-09-16 NOTE — Care Management Note (Signed)
Case Management Note  Patient Details  Name: Radene OuLarry Kroboth MRN: [id number] Date of Birth: 04/30/1951  Subjective/Objective:                    Action/Plan:  Discussed home health RN with patient , at present he declined home health . He did request WOC nurse to see him today before he leaves . Bedside nurse aware [facility].  Expected Discharge Date:                  Expected Discharge Plan:  Home/Self Care  In-House Referral:     Discharge planning Services  CM Consult  Post Acute Care Choice:    Choice offered to:  Patient  DME Arranged:    DME Agency:     HH Arranged:    HH Agency:     Status of Service:  Completed, signed off  Medicare Important Message Given:    Date Medicare IM Given:    Medicare IM give by:    Date Additional Medicare IM Given:    Additional Medicare Important Message give by:     If discussed at Long Length of Stay Meetings, dates discussed:    Additional Comments:  Kingsley PlanWile, Nadean Montanaro Marie, RN 09/16/2015, 10:25 AM

## 2015-09-16 NOTE — Progress Notes (Signed)
Discussed discharge summary with patient. Reviewed all medications with patient. Patient received Rx. Patient ready for discharge. 

## 2015-09-16 NOTE — Consult Note (Addendum)
WOC ostomy follow up Pt states he has been independently emptying his ostomy pouch.   He plans to discharge today and declines offer of another pouch change demonstration or any further questions regarding ostomy care.  Reviewed ordering supplies and dietary precautions with an ileostomy. Pouch intact with good seal.4 extra pouches in room for use after discharge.  Educational materials left at the bedside and pt placed on [employer] discharge program. Cammie Mcgeeawn Leyna Vanderkolk MSN, RN, Rober MinionCWOCN, [place], [place] [phone]

## 2015-09-16 NOTE — Discharge Summary (Signed)
Physician Discharge Summary  Shahrukh Pasch ZOX:[id number] DOB: 06-25-1950 DOA: 09/11/2015  PCP: No PCP Per Patient  Consultation: internal medicine   Admit date: 09/11/2015 Discharge date: 09/16/2015  Recommendations for Outpatient Follow-up:    Follow-up Information    Follow up with [facility] On 09/23/2015.   Specialty:  General Surgery   Why:  10AM for have your staples removed.    Contact information:   [address] [place] [address] [phone]       Follow up with [name]., MD In 2 weeks.   Specialty:  General Surgery   Why:  for a post op check up    Contact information:   [address] [place] [address] [phone]       Follow up with primary care doctor.   Why:  for follow up on medical problems and be referred for a sleep study      Discharge Diagnoses:  1. Sigmoid colon obstruction 2. Pneumatosis  3. Diverticulitis 4. Acute appendicitis    Surgical Procedure: POD#4 exploratory laparotomy, subtotal colectomy, ileostomy---Dr. [name]  Discharge Condition: stable Disposition: home  Diet recommendation: regular  Filed Weights   09/11/15 0521 09/11/15 2055 09/14/15 1501  Weight: 108.863 kg (240 lb) 104.9 kg (231 lb 4.2 oz) 100 kg (220 lb 7.4 oz)     Filed Vitals:   09/15/15 2155 09/16/15 0512  BP: 158/87 146/85  Pulse: 83 74  Temp: 98.7 F (37.1 C) 99.1 F (37.3 C)  Resp: 17 17    HPI: Jimmy Pearson is a 65 year old male with a history of diverticulitis, RIH repair with mesh and hypertension who presents with above complaints. He reports over the last 3 years intermittently feeling a mass and feeling fullness on the left side. This would resolve on its own. About 2 weeks ago, he began to develop distention, constipation(last BM was Sunday), pain on Monday and nausea this AM. Prior to 2 weeks, his bowels were normal, appetite was good, he would get occasional hematochezia which he attributed to  hemorrhoids. His appetite has been fair over the last week. Unsure of last colonoscopy, may have even been 20 years ago when he was diagnosed with diverticulitis. Family history significant for father with colon cancer at age 4.  The patient was in the ED in October and treated for a bout of diverticulitis. He declined a colonoscopy at this time.  ED work up reveals, normal WBC, normal renal and hepatic function. CT of abdomen and pelvis reveals a distal colonic obstruction likely secondary to a mass, subtle pneumatosis and a dilated cecum without evidence of perforation. We have therefore been asked to evaluate.     Hospital Course:  He was admitted and underwent the procedure listed above.  Post operative, he was transferred to the step down unit for monitoring of hypoxia.  hospitalist service was also consulted for management who recommended outpatient PFTs and a sleep study.  He did not have further issues.  Diet was advanced as ileostomy functioned.  WOC was consulted on teaching.  His pathology revealed diverticulitis and appendicitis.  I reviewed this with the patient in detail.  On POD#5 he was felt stable for discharge home.  Medication risks, benefits and therapeutic alternatives were reviewed with the patient.  He verbalizes understanding.  I scheduled him a follow up with the nurses to have staples removed.   He will need to schedule a post op check with Dr. [name]  in 2-3 weeks.  I encouraged him to establish care with a primary care doctor for management of chronic medical problems.  Home health was arranged for ongoing teaching.     Physical Exam: General: Pt awake/alert/oriented x4 in no acute distress Chest: cta. No chest wall pain w good excursion CV: Pulses intact. Regular rhythm Abdomen: Soft. Nondistended. Mildly tender at incisions only. staples in place without erythema. Stoma is pink and viable, stool and air present. No evidence of peritonitis. No incarcerated  hernias. Ext: SCDs BLE. No mjr edema. No cyanosis Skin: No petechiae / purpura  Discharge Instructions     Medication List    STOP taking these medications        ciprofloxacin 500 MG tablet  Commonly known as:  CIPRO     HYDROcodone-acetaminophen 5-325 MG tablet  Commonly known as:  NORCO/VICODIN     metroNIDAZOLE 500 MG tablet  Commonly known as:  FLAGYL      TAKE these medications        amLODipine 5 MG tablet  Commonly known as:  NORVASC  Take 5 mg by mouth daily.     benazepril 20 MG tablet  Commonly known as:  LOTENSIN  Take 20 mg by mouth every evening.     finasteride 5 MG tablet  Commonly known as:  PROSCAR  Take 5 mg by mouth daily.     montelukast 10 MG tablet  Commonly known as:  SINGULAIR  Take 1 tablet (10 mg total) by mouth at bedtime.     oxyCODONE-acetaminophen 5-325 MG tablet  Commonly known as:  PERCOCET/ROXICET  Take 1-2 tablets by mouth every 4 (four) hours as needed for moderate pain or severe pain.           Follow-up Information    Follow up with [facility] On 09/23/2015.   Specialty:  General Surgery   Why:  10AM for have your staples removed.    Contact information:   [address] [place] [address] [phone]       Follow up with Wynona LunaSUEI,MATTHEW K., MD In 2 weeks.   Specialty:  General Surgery   Why:  for a post op check up    Contact information:   [address] [place] [address] [phone]        The results of significant diagnostics from this hospitalization (including imaging, microbiology, ancillary and laboratory) are listed below for reference.    Significant Diagnostic Studies: Ct Angio Chest Pe W/cm &/or Wo Cm  09/14/2015  CLINICAL DATA:  Status post subtotal colectomy for a distal colonic mass. Shortness of breath. Evaluate for pulmonary embolus. EXAM: CT ANGIOGRAPHY CHEST WITH CONTRAST TECHNIQUE: Multidetector CT imaging of the chest was performed using the  standard protocol during bolus administration of intravenous contrast. Multiplanar CT image reconstructions and MIPs were obtained to evaluate the vascular anatomy. CONTRAST:  90 mL of Isovue 370 COMPARISON:  Chest x-ray Sep 12, 2015 FINDINGS: The central airways are normal. No pneumothorax. There is mild dependent atelectasis, greater on the left than the right. No suspicious nodules, masses, or infiltrates. The ascending thoracic aorta measures 4.3 cm which is slightly prominent. No dissection or other aortic abnormality. The heart size is borderline. No effusions. There is a prominent lymph node in the superior mediastinum to the left measuring 12 x 30 mm. No other enlarged lymph nodes are seen in the chest. No pulmonary emboli. There is a small amount of fluid  around the superior spleen which could be postop given history. High attenuation in the gallbladder could represent sludge. Limited views of the upper abdomen are otherwise normal. No acute bony abnormalities. Review of the MIP images confirms the above findings. IMPRESSION: 1. No pulmonary emboli. 2. The ascending thoracic aorta measures 4.3 cm which is slightly prominent. 3. There is a lymph node in the superior mediastinum measuring 13 x 30 mm. This is nonspecific. No other adenopathy. 4. There is a small amount of fluid adjacent to the spleen, possibly postoperative given reported history. Electronically Signed   By: Gerome Sam III M.D   On: 09/14/2015 11:27   Ct Abdomen Pelvis W Contrast  09/11/2015  CLINICAL DATA:  65 year old male with diffuse lower abdominal pain, nausea and constipation. Past history of diverticulitis. EXAM: CT ABDOMEN AND PELVIS WITH CONTRAST TECHNIQUE: Multidetector CT imaging of the abdomen and pelvis was performed using the standard protocol following bolus administration of intravenous contrast. CONTRAST:  ISOVUE-300 IOPAMIDOL (ISOVUE-300) INJECTION 61% COMPARISON:  None. FINDINGS: Lower chest:  No acute findings.   Small hiatal hernia. Hepatobiliary: Small 7 mm circumscribed low-attenuation lesion in the superior aspect of hepatic segment 4A is too small characterize but statistically highly likely a benign cyst. The remainder the liver is normal in contour without cirrhotic morphology. The parenchyma is diffusely low in attenuation suggesting steatosis. Gallbladder is unremarkable. No intra or extrahepatic biliary ductal dilatation. Pancreas: No mass, inflammatory changes, or other significant abnormality. Spleen: Within normal limits in size and appearance. Adrenals/Urinary Tract: No masses identified. No evidence of hydronephrosis. Stomach/Bowel: Diffuse distention of the colon with an abrupt transition in the sigmoid colon were there is some evidence of irregular wall thickening concerning for an underlying obstructing mass. The region is a amorphous and somewhat difficult to measure but is between 3.5 and 6.4 cm in length. The cecum is markedly dilated at 11.3 cm in diameter. There is subtle pneumatosis along the posterior wall of the cecum. Vascular/Lymphatic: No pathologically enlarged lymph nodes. No evidence of abdominal aortic aneurysm. Reproductive: No mass or other significant abnormality. Other: None. Musculoskeletal:  No suspicious bone lesions identified. IMPRESSION: 1. CT findings are concerning for distal colonic obstruction, likely secondary to a subtle and ill-defined mass measuring between 3.5 and 6.4 cm (precise measurement is difficult given the indistinct margins of the lesion). 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 The cecum is markedly dilated measuring up to 11 cm. While there is no evidence of perforation at this time, there is subtle pneumatosis along the posterior wall of the cecum in the region of the ileocecal valve raising concern for impending perforation. Recommend surgical consultation. 2. Nonspecific 7 mm low-attenuation lesion in  the superior aspect of hepatic segment 4A is too small to characterize but warrants attention on follow-up imaging. 3. Small hiatal hernia. 4. Hepatic steatosis. These results were called by telephone at the time of interpretation on 09/11/2015 at 8:17 am to Dr. Lynelle Doctor, who verbally acknowledged these results. Electronically Signed   By: Malachy Moan M.D.   On: 09/11/2015 08:17   Dg Chest Port 1 View  09/12/2015  CLINICAL DATA:  Extreme shortness of breath beginning this morning. Hypoxia. EXAM: PORTABLE CHEST 1 VIEW COMPARISON:  09/12/2015 FINDINGS: Heart and mediastinal contours are within normal limits. No focal opacities or effusions. No acute bony abnormality. IMPRESSION: No active disease. Electronically Signed   By: Charlett Nose M.D.   On: 09/12/2015 12:03    Microbiology:  Recent Results (from the past 240 hour(s))  MRSA PCR Screening     Status: None   Collection Time: 09/12/15  5:18 AM  Result Value Ref Range Status   MRSA by PCR NEGATIVE NEGATIVE Final    Comment:        The GeneXpert MRSA Assay (FDA approved for NASAL specimens only), is one component of a comprehensive MRSA colonization surveillance program. It is not intended to diagnose MRSA infection nor to guide or monitor treatment for MRSA infections.      Labs: Basic Metabolic Panel:  Recent Labs Lab 09/11/15 0532 09/12/15 0440 09/14/15 0331  NA 135 142 138  K 3.9 5.1 3.9  CL 99* 100* 97*  CO2 24 31 32  GLUCOSE 127* 128* 104*  BUN 12 10 8   CREATININE 0.74 0.89 0.64  CALCIUM 9.4 8.7* 8.8*   Liver Function Tests:  Recent Labs Lab 09/11/15 0532 09/12/15 0440 09/14/15 0331  AST 16 22 19   ALT 20 21 16*  ALKPHOS 56 38 35*  BILITOT 0.8 1.0 0.9  PROT 7.2 5.8* 6.2*  ALBUMIN 4.1 3.4* 3.0*    Recent Labs Lab 09/11/15 0532  LIPASE 31   No results for input(s): AMMONIA in the last 168 hours. CBC:  Recent Labs Lab 09/11/15 0532 09/12/15 0440 09/14/15 0331  WBC 6.7 9.9 6.5  NEUTROABS 5.3  --    --   HGB 15.6 14.5 13.6  HCT 47.7 45.2 42.6  MCV 92.6 93.6 93.8  PLT 174 160 176   Cardiac Enzymes: No results for input(s): CKTOTAL, CKMB, CKMBINDEX, TROPONINI in the last 168 hours. BNP: BNP (last 3 results)  Recent Labs  09/13/15 1654  BNP 76.9    ProBNP (last 3 results) No results for input(s): PROBNP in the last 8760 hours.  CBG:  Recent Labs Lab 09/11/15 1323 09/13/15 0827  GLUCAP 121* 91    Active Problems:   Colonic obstruction s/p subtotal colectomy/ileostomy 09/11/15   Colon obstruction (HCC)   Colonic mass   Hypoxia   HTN (hypertension)   Signed:  Omir Cooprider, ANP-BC

## 2015-09-16 NOTE — Discharge Instructions (Signed)

## 2015-09-19 NOTE — Anesthesia Postprocedure Evaluation (Signed)
Anesthesia Post Note  Patient: Jimmy Pearson  Procedure(s) Performed: Procedure(s) (LRB): COLON RESECTION (N/A) EXPLORATORY LAPAROTOMY COLOSTOMY (N/A)  Patient location during evaluation: PACU Anesthesia Type: General Level of consciousness: awake Pain management: pain level controlled Vital Signs Assessment: post-procedure vital signs reviewed and stable Respiratory status: spontaneous breathing, nonlabored ventilation and respiratory function stable Cardiovascular status: blood pressure returned to baseline Anesthetic complications: no    Last Vitals:  Filed Vitals:   09/16/15 0512 09/16/15 1400  BP: 146/85 138/97  Pulse: 74 71  Temp: 37.3 C 37.3 C  Resp: 17 18    Last Pain:  Filed Vitals:   09/16/15 1452  PainSc: Asleep                 Norie Latendresse COKER

## 2015-09-23 ENCOUNTER — Emergency Department (HOSPITAL_COMMUNITY): Payer: Medicare Other

## 2015-09-23 ENCOUNTER — Encounter (HOSPITAL_COMMUNITY): Payer: Self-pay | Admitting: Nurse Practitioner

## 2015-09-23 ENCOUNTER — Emergency Department (HOSPITAL_COMMUNITY)
Admission: EM | Admit: 2015-09-23 | Discharge: 2015-09-24 | Disposition: A | Discharge status: Home / Self Care | Payer: Medicare Other | Attending: Emergency Medicine | Admitting: Emergency Medicine

## 2015-09-23 DIAGNOSIS — F419 Anxiety disorder, unspecified: Secondary | ICD-10-CM | POA: Diagnosis not present

## 2015-09-23 DIAGNOSIS — R002 Palpitations: Secondary | ICD-10-CM | POA: Insufficient documentation

## 2015-09-23 DIAGNOSIS — I10 Essential (primary) hypertension: Secondary | ICD-10-CM | POA: Diagnosis not present

## 2015-09-23 DIAGNOSIS — Z933 Colostomy status: Secondary | ICD-10-CM | POA: Diagnosis not present

## 2015-09-23 DIAGNOSIS — R079 Chest pain, unspecified: Secondary | ICD-10-CM | POA: Diagnosis not present

## 2015-09-23 DIAGNOSIS — R0602 Shortness of breath: Secondary | ICD-10-CM | POA: Diagnosis not present

## 2015-09-23 DIAGNOSIS — Z79899 Other long term (current) drug therapy: Secondary | ICD-10-CM | POA: Diagnosis not present

## 2015-09-23 DIAGNOSIS — R1084 Generalized abdominal pain: Secondary | ICD-10-CM | POA: Insufficient documentation

## 2015-09-23 DIAGNOSIS — Z8719 Personal history of other diseases of the digestive system: Secondary | ICD-10-CM | POA: Insufficient documentation

## 2015-09-23 LAB — BASIC METABOLIC PANEL
Anion gap: 10 (ref 5–15)
BUN: 28 mg/dL — AB (ref 6–20)
CHLORIDE: 95 mmol/L — AB (ref 101–111)
CO2: 25 mmol/L (ref 22–32)
Calcium: 9.6 mg/dL (ref 8.9–10.3)
Creatinine, Ser: 1.27 mg/dL — ABNORMAL HIGH (ref 0.61–1.24)
GFR calc Af Amer: >60 mL/min (ref >60)
GFR calc non Af Amer: 59 mL/min — ABNORMAL LOW (ref >60)
GLUCOSE: 97 mg/dL (ref 65–99)
POTASSIUM: 4.9 mmol/L (ref 3.5–5.1)
Sodium: 130 mmol/L — ABNORMAL LOW (ref 135–145)

## 2015-09-23 LAB — CBC
HEMATOCRIT: 42.3 % (ref 39.0–52.0)
Hemoglobin: 14 g/dL (ref 13.0–17.0)
MCH: 29.9 pg (ref 26.0–34.0)
MCHC: 33.1 g/dL (ref 30.0–36.0)
MCV: 90.2 fL (ref 78.0–100.0)
Platelets: 333 10*3/uL (ref 150–400)
RBC: 4.69 MIL/uL (ref 4.22–5.81)
RDW: 12.6 % (ref 11.5–15.5)
WBC: 8.2 10*3/uL (ref 4.0–10.5)

## 2015-09-23 LAB — I-STAT TROPONIN, ED
TROPONIN I, POC: 0 ng/mL (ref 0.00–0.08)
Troponin i, poc: 0.01 ng/mL (ref 0.00–0.08)

## 2015-09-23 LAB — D-DIMER, QUANTITATIVE (NOT AT ARMC): D DIMER QUANT: 1.06 ug{FEU}/mL — AB (ref 0.00–0.50)

## 2015-09-23 MED ORDER — LORAZEPAM 2 MG/ML IJ SOLN
0.5000 mg | Freq: Once | INTRAMUSCULAR | Status: AC
  Administered 2015-09-23: 0.5 mg via INTRAVENOUS
  Filled 2015-09-23: qty 1

## 2015-09-23 MED ORDER — IOPAMIDOL (ISOVUE-370) INJECTION 76%
INTRAVENOUS | Status: AC
  Administered 2015-09-24: 100 mL
  Filled 2015-09-23: qty 100

## 2015-09-23 MED ORDER — SODIUM CHLORIDE 0.9 % IV BOLUS (SEPSIS)
500.0000 mL | Freq: Once | INTRAVENOUS | Status: AC
  Administered 2015-09-23: 500 mL via INTRAVENOUS

## 2015-09-23 MED ORDER — LORAZEPAM 1 MG PO TABS: 1.0000 mg | ORAL_TABLET | Freq: Three times a day (TID) | ORAL | Status: DC | PRN

## 2015-09-23 NOTE — ED Notes (Signed)
Called the patient for triage, he is in the restroom

## 2015-09-23 NOTE — ED Notes (Signed)
Called pt for triage x 2 , he remains in the restroom

## 2015-09-23 NOTE — ED Provider Notes (Signed)
CSN: [id number]     Arrival date & time 09/23/15  1631 History   First MD Initiated Contact with Patient 09/23/15 2040     Chief Complaint  Patient presents with  . Shortness of Breath     (Consider location/radiation/quality/duration/timing/severity/associated sxs/prior Treatment) HPI Comments: The patient presents to the emergency department for evaluation of SOB, worse when lying down, over the last 1 day. No cough or fever. He feels a fullness in his chest but is hesitant to call it a pain. He underwent a colostomy on 09/11/15 secondary to severe diverticular disease and was seen in follow up this morning by Dr. [name]. He denies significant increased abdominal pain or vomiting.   Patient is a 65 y.o. male presenting with shortness of breath. The history is provided by the patient. No language interpreter was used.  Shortness of Breath Severity:  Moderate Onset quality:  Gradual Associated symptoms: abdominal pain (Pain from recent surgery. )   Associated symptoms: no cough, no fever and no vomiting     Past Medical History  Diagnosis Date  . Diverticulitis   . Hypertension    Past Surgical History  Procedure Laterality Date  . Hernia repair    . Colon resection N/A 09/11/2015    Procedure: COLON RESECTION;  Surgeon: Manus Rudd, MD;  Location: [facility] OR;  Service: General;  Laterality: N/A;  . Laparotomy  09/11/2015    Procedure: EXPLORATORY LAPAROTOMY;  Surgeon: Manus Rudd, MD;  Location: [facility] OR;  Service: General;;  . Colostomy N/A 09/11/2015    Procedure: COLOSTOMY;  Surgeon: Manus Rudd, MD;  Location: [facility] OR;  Service: General;  Laterality: N/A;   Family History  Problem Relation Age of Onset  . Colon cancer Father    Social History  Substance Use Topics  . Smoking status: Never Smoker   . Smokeless tobacco: None  . Alcohol Use: Yes     Comment: wine with meals    Review of Systems  Constitutional: Negative for fever and chills.  Respiratory: Positive for chest  tightness and shortness of breath. Negative for cough.   Cardiovascular: Positive for palpitations.  Gastrointestinal: Positive for abdominal pain (Pain from recent surgery. ). Negative for vomiting.  Genitourinary: Negative.   Musculoskeletal: Negative.  Negative for myalgias.  Skin: Negative.   Neurological: Negative.       Allergies  Review of patient's allergies indicates no known allergies.  Home Medications   Prior to Admission medications   Medication Sig Start Date End Date Taking? Authorizing Provider  amLODipine (NORVASC) 5 MG tablet Take 5 mg by mouth daily.    Historical Provider, MD  benazepril (LOTENSIN) 20 MG tablet Take 20 mg by mouth every evening.    Historical Provider, MD  finasteride (PROSCAR) 5 MG tablet Take 5 mg by mouth daily.    Historical Provider, MD  montelukast (SINGULAIR) 10 MG tablet Take 1 tablet (10 mg total) by mouth at bedtime. 09/16/15   Emina Riebock, NP  oxyCODONE-acetaminophen (PERCOCET/ROXICET) 5-325 MG tablet Take 1-2 tablets by mouth every 4 (four) hours as needed for moderate pain or severe pain. 09/16/15   Emina Riebock, NP   BP 114/59 mmHg  Pulse 76  Temp(Src) 98.3 F (36.8 C) (Oral)  Resp 19  SpO2 99% Physical Exam  Constitutional: He is oriented to person, place, and time. He appears well-developed and well-nourished.  HENT:  Head: Normocephalic.  Neck: Normal range of motion. Neck supple.  Cardiovascular: Normal rate and regular rhythm.   Pulmonary/Chest: Effort  normal and breath sounds normal. He has no wheezes. He has no rales. He exhibits no tenderness.  Abdominal: Soft. Bowel sounds are normal. There is tenderness (Mild, diffuse tenderness abdomen.). There is no rebound and no guarding.  Colostomy bag in place. Stoma unremarkable in appearance. Green stool in bag.   Musculoskeletal: Normal range of motion.  Neurological: He is alert and oriented to person, place, and time. Coordination normal.  Skin: Skin is warm and dry. No  rash noted.  Psychiatric: He has a normal mood and affect.    ED Course  Procedures (including critical care time) Labs Review Labs Reviewed  BASIC METABOLIC PANEL - Abnormal; Notable for the following:    Sodium 130 (*)    Chloride 95 (*)    BUN 28 (*)    Creatinine, Ser 1.27 (*)    GFR calc non Af Amer 59 (*)    All other components within normal limits  CBC  I-STAT TROPOININ, ED  I-STAT TROPOININ, ED   Results for orders placed or performed during the hospital encounter of 09/23/15  Basic metabolic panel  Result Value Ref Range   Sodium 130 (L) 135 - 145 mmol/L   Potassium 4.9 3.5 - 5.1 mmol/L   Chloride 95 (L) 101 - 111 mmol/L   CO2 25 22 - 32 mmol/L   Glucose, Bld 97 65 - 99 mg/dL   BUN 28 (H) 6 - 20 mg/dL   Creatinine, Ser 4.091.27 (H) 0.61 - 1.24 mg/dL   Calcium 9.6 8.9 - 81.110.3 mg/dL   GFR calc non Af Amer 59 (L) >60 mL/min   GFR calc Af Amer >60 >60 mL/min   Anion gap 10 5 - 15  CBC  Result Value Ref Range   WBC 8.2 4.0 - 10.5 K/uL   RBC 4.69 4.22 - 5.81 MIL/uL   Hemoglobin 14.0 13.0 - 17.0 g/dL   HCT 91.442.3 78.239.0 - 95.652.0 %   MCV 90.2 78.0 - 100.0 fL   MCH 29.9 26.0 - 34.0 pg   MCHC 33.1 30.0 - 36.0 g/dL   RDW 21.312.6 08.611.5 - 57.815.5 %   Platelets 333 150 - 400 K/uL  D-dimer, quantitative (not at [facility])  Result Value Ref Range   D-Dimer, Quant 1.06 (H) 0.00 - 0.50 ug/mL-FEU  I-stat troponin, ED  Result Value Ref Range   Troponin i, poc 0.01 0.00 - 0.08 ng/mL   Comment 3          I-stat troponin, ED  Result Value Ref Range   Troponin i, poc 0.00 0.00 - 0.08 ng/mL   Comment 3           Dg Chest 2 View  09/23/2015  CLINICAL DATA:  Two weeks post colectomy, now having shortness of breath, heaviness in chest for 2 days greater when lying down, history hypertension EXAM: CHEST  2 VIEW COMPARISON:  09/12/2015 FINDINGS: Upper normal heart size. Atherosclerotic calcification aorta. Mediastinal contours and pulmonary vascularity normal. Lungs clear. No pleural effusion or  pneumothorax. Minimal degenerative disc disease changes thoracic spine pre IMPRESSION: No acute abnormalities. Electronically Signed   By: Ulyses SouthwardMark  Boles M.D.   On: 09/23/2015 18:34   Ct Angio Chest Pe W/cm &/or Wo Cm  09/24/2015  CLINICAL DATA:  Shortness of breath.  Elevated D-dimer. EXAM: CT ANGIOGRAPHY CHEST WITH CONTRAST TECHNIQUE: Multidetector CT imaging of the chest was performed using the standard protocol during bolus administration of intravenous contrast. Multiplanar CT image reconstructions and MIPs were obtained to evaluate  the vascular anatomy. CONTRAST:  80 mL Isovue 370 COMPARISON:  09/14/2015 FINDINGS: Technically adequate study with good opacification of the central and segmental pulmonary arteries. No focal filling defects demonstrated. No evidence of significant pulmonary embolus. Normal heart size. Ectatic ascending thoracic aorta at 4.1 cm diameter. No evidence of aortic dissection. Great vessel origins are patent. Esophagus is decompressed. No significant lymphadenopathy in the chest. Evaluation of lungs is limited due to respiratory motion artifact but no focal consolidation or airspace disease is suggested. No pleural effusions. No pneumothorax. Airways appear patent. Included portions of the upper abdominal organs are grossly unremarkable. Degenerative changes in the spine. No destructive bone lesions. Review of the MIP images confirms the above findings. IMPRESSION: No evidence of significant pulmonary embolus. No evidence of active pulmonary disease. 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 Electronically Signed   By: Burman Nieves M.D.   On: 09/24/2015 04:37   Ct Angio Chest Pe W/cm &/or Wo Cm  09/14/2015  CLINICAL DATA:  Status post subtotal colectomy for a distal colonic mass. Shortness of breath. Evaluate for pulmonary embolus. EXAM: CT ANGIOGRAPHY CHEST WITH CONTRAST TECHNIQUE: Multidetector CT imaging of the chest was performed using the standard protocol during bolus  administration of intravenous contrast. Multiplanar CT image reconstructions and MIPs were obtained to evaluate the vascular anatomy. CONTRAST:  90 mL of Isovue 370 COMPARISON:  Chest x-ray Sep 12, 2015 FINDINGS: The central airways are normal. No pneumothorax. There is mild dependent atelectasis, greater on the left than the right. No suspicious nodules, masses, or infiltrates. The ascending thoracic aorta measures 4.3 cm which is slightly prominent. No dissection or other aortic abnormality. The heart size is borderline. No effusions. There is a prominent lymph node in the superior mediastinum to the left measuring 12 x 30 mm. No other enlarged lymph nodes are seen in the chest. No pulmonary emboli. There is a small amount of fluid around the superior spleen which could be postop given history. High attenuation in the gallbladder could represent sludge. Limited views of the upper abdomen are otherwise normal. No acute bony abnormalities. Review of the MIP images confirms the above findings. IMPRESSION: 1. No pulmonary emboli. 2. The ascending thoracic aorta measures 4.3 cm which is slightly prominent. 3. There is a lymph node in the superior mediastinum measuring 13 x 30 mm. This is nonspecific. No other adenopathy. 4. There is a small amount of fluid adjacent to the spleen, possibly postoperative given reported history. Electronically Signed   By: Gerome Sam III M.D   On: 09/14/2015 11:27   Ct Abdomen Pelvis W Contrast  09/11/2015  CLINICAL DATA:  65 year old male with diffuse lower abdominal pain, nausea and constipation. Past history of diverticulitis. EXAM: CT ABDOMEN AND PELVIS WITH CONTRAST TECHNIQUE: Multidetector CT imaging of the abdomen and pelvis was performed using the standard protocol following bolus administration of intravenous contrast. CONTRAST:  ISOVUE-300 IOPAMIDOL (ISOVUE-300) INJECTION 61% COMPARISON:  None. FINDINGS: Lower chest:  No acute findings.  Small hiatal hernia.  Hepatobiliary: Small 7 mm circumscribed low-attenuation lesion in the superior aspect of hepatic segment 4A is too small characterize but statistically highly likely a benign cyst. The remainder the liver is normal in contour without cirrhotic morphology. The parenchyma is diffusely low in attenuation suggesting steatosis. Gallbladder is unremarkable. No intra or extrahepatic biliary ductal dilatation. Pancreas: No mass, inflammatory changes, or other significant abnormality. Spleen: Within normal limits in size and appearance. Adrenals/Urinary Tract: No masses identified. No evidence of hydronephrosis.  Stomach/Bowel: Diffuse distention of the colon with an abrupt transition in the sigmoid colon were there is some evidence of irregular wall thickening concerning for an underlying obstructing mass. The region is a amorphous and somewhat difficult to measure but is between 3.5 and 6.4 cm in length. The cecum is markedly dilated at 11.3 cm in diameter. There is subtle pneumatosis along the posterior wall of the cecum. Vascular/Lymphatic: No pathologically enlarged lymph nodes. No evidence of abdominal aortic aneurysm. Reproductive: No mass or other significant abnormality. Other: None. Musculoskeletal:  No suspicious bone lesions identified. IMPRESSION: 1. CT findings are concerning for distal colonic obstruction, likely secondary to a subtle and ill-defined mass measuring between 3.5 and 6.4 cm (precise measurement is difficult given the indistinct margins of the lesion). 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 The cecum is markedly dilated measuring up to 11 cm. While there is no evidence of perforation at this time, there is subtle pneumatosis along the posterior wall of the cecum in the region of the ileocecal valve raising concern for impending perforation. Recommend surgical consultation. 2. Nonspecific 7 mm low-attenuation lesion in the superior aspect of  hepatic segment 4A is too small to characterize but warrants attention on follow-up imaging. 3. Small hiatal hernia. 4. Hepatic steatosis. These results were called by telephone at the time of interpretation on 09/11/2015 at 8:17 am to Dr. Lynelle Doctor, who verbally acknowledged these results. Electronically Signed   By: Malachy Moan M.D.   On: 09/11/2015 08:17   Dg Chest Port 1 View  09/12/2015  CLINICAL DATA:  Extreme shortness of breath beginning this morning. Hypoxia. EXAM: PORTABLE CHEST 1 VIEW COMPARISON:  09/12/2015 FINDINGS: Heart and mediastinal contours are within normal limits. No focal opacities or effusions. No acute bony abnormality. IMPRESSION: No active disease. Electronically Signed   By: Charlett Nose M.D.   On: 09/12/2015 12:03     Imaging Review Dg Chest 2 View  09/23/2015  CLINICAL DATA:  Two weeks post colectomy, now having shortness of breath, heaviness in chest for 2 days greater when lying down, history hypertension EXAM: CHEST  2 VIEW COMPARISON:  09/12/2015 FINDINGS: Upper normal heart size. Atherosclerotic calcification aorta. Mediastinal contours and pulmonary vascularity normal. Lungs clear. No pleural effusion or pneumothorax. Minimal degenerative disc disease changes thoracic spine pre IMPRESSION: No acute abnormalities. Electronically Signed   By: Ulyses Southward M.D.   On: 09/23/2015 18:34   I have personally reviewed and evaluated these images and lab results as part of my medical decision-making.   EKG Interpretation   Date/Time:  Tuesday Sep 23 2015 17:09:58 EDT Ventricular Rate:  78 PR Interval:  176 QRS Duration: 86 QT Interval:  338 QTC Calculation: 385 R Axis:   55 Text Interpretation:  Normal sinus rhythm Anterior infarct , age  undetermined Abnormal ECG No old tracing to compare Confirmed by GOLDSTON  MD, SCOTT [phone]) on 09/23/2015 8:18:57 PM      MDM   Final diagnoses:  None    1. Dyspnea 2. Anxiety  The patient presents with complaint of SOB  for one day. He feels it when he lies down and is worried he will stop breathing during sleep. No fever or cough. He denies significant exertional dyspnea. There is a discomfort in his chest.  Labs have been unremarkable. CXR clear of infection. D-dimer elevated causing concern for PE. CTA chest was negative for PE. He is resting better with Ativan and is comfortable returning home. Discussed  role of anxiety in symptoms and importance of PCP follow up.     Elpidio Anis, PA-C 09/27/15 4540  Pricilla Loveless, MD 09/27/15 [phone]

## 2015-09-23 NOTE — Discharge Instructions (Signed)
FOLLOW UP WITH YOUR DOCTOR FOR RECHECK AND FURTHER OUTPATIENT EVALUATION AS NEEDED. TAKE ATIVAN AS PRESCRIBED FOR SYMPTOMATIC RELIEF. RETURN TO THE EMERGENCY DEPARTMENT WITH ANY NEW OR WORSENING SYMPTOMS.

## 2015-09-23 NOTE — ED Notes (Signed)
Pt c/o SOB, increased when lying down over past day. Today he began to have "discomfort" around his heart. He feels he has to sit up to breathe. He had abd surgery with colostomy on 5/4, he saw dr tseiu in the office this am and was told the site may be infected but he was not placed on abx yet. He is alert and breathing easily

## 2015-09-23 NOTE — ED Notes (Signed)
Spoke to EustaceRea in main lab, d-dimer to be added on.

## 2015-09-23 NOTE — ED Notes (Signed)
Pt requests to speak to an RN, pt tells this RN that he is having dizziness, diaphoresis, & having hot flashes

## 2015-09-24 ENCOUNTER — Encounter (HOSPITAL_COMMUNITY): Payer: Self-pay | Admitting: Radiology

## 2015-09-24 DIAGNOSIS — R0602 Shortness of breath: Secondary | ICD-10-CM | POA: Diagnosis not present

## 2015-09-24 NOTE — ED Notes (Signed)
Please send downtime charting.

## 2015-09-24 NOTE — Anesthesia Postprocedure Evaluation (Signed)
Anesthesia Post Note  Patient: Jimmy Pearson  Procedure(s) Performed: Procedure(s) (LRB): COLON RESECTION (N/A) EXPLORATORY LAPAROTOMY COLOSTOMY (N/A)  Patient location during evaluation: PACU Anesthesia Type: General Level of consciousness: awake and alert Pain management: pain level controlled Vital Signs Assessment: post-procedure vital signs reviewed and stable Respiratory status: spontaneous breathing, nonlabored ventilation and respiratory function stable Cardiovascular status: blood pressure returned to baseline Anesthetic complications: no    Last Vitals:  Filed Vitals:   09/16/15 0512 09/16/15 1400  BP: 146/85 138/97  Pulse: 74 71  Temp: 37.3 C 37.3 C  Resp: 17 18    Last Pain:  Filed Vitals:   09/16/15 1452  PainSc: Asleep                 Ilanna Deihl COKER

## 2015-09-24 NOTE — Addendum Note (Signed)
Addendum  created 09/24/15 1007 by Kipp Broodavid Samba Cumba, MD   Modules edited: Anesthesia Attestations, Notes Section   Notes Section:  File: [id number]

## 2016-01-09 ENCOUNTER — Ambulatory Visit: Payer: Self-pay | Admitting: Surgery

## 2016-01-09 DIAGNOSIS — Z9049 Acquired absence of other specified parts of digestive tract: Secondary | ICD-10-CM | POA: Diagnosis not present

## 2016-01-09 DIAGNOSIS — K5792 Diverticulitis of intestine, part unspecified, without perforation or abscess without bleeding: Secondary | ICD-10-CM | POA: Diagnosis not present

## 2016-01-09 NOTE — H&P (Signed)
History of Present Illness Jimmy Pearson(Jimmy Ramthun K. Jhayla Podgorski MD; 01/09/2016 12:16 PM) Patient words: postop.  The patient is a 65 year old male who presents with diverticulitis. This patient was seen urgently on 09/11/15 with a complete distal colon obstruction with massive dilation of the entire colon and pneumatosis of the cecum. 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 There were multiple serosal tears throughout the colon, especially in the cecum. There was no sign of gross contamination. He underwent subtotal colectomy with end ileostomy. Pathology confirmed that the mass was inflammatory and there is no sign of malignancy. He was discharged home on 09/16/15. He had a small fluid collection below the lower end of his incision which was evacuated. This area is being dressed with a dry dressing and is now completely healed. The ileostomy output depends on his diet, but he is able to maintain adequate hydration.Marland Kitchen. His urine output is clear and copious. He does not report any dry mouth. He is drinking Gatorade frequently. His appetite is improving.  The patient is doing very well. No abdominal pain. His ileostomy is functioning well but he has no issues with dehydration and hypovolemia. He is looking forward to having his ileostomy reversed. He is no longer have any issues with the pouch.   Problem List/Past Medical Jimmy Pearson(Jimmy Pearson K Murel Shenberger, MD; 01/09/2016 12:16 PM) DIVERTICULITIS WITH OBSTRUCTION (K57.92) ENCOUNTER FOR REMOVAL OF STAPLES (Z48.02) S/P TOTAL COLECTOMY (Z90.49)  Other Problems Jimmy Pearson(Jimmy Pearson K Lekisha Mcghee, MD; 01/09/2016 12:16 PM) Inguinal Hernia Sleep Apnea Enlarged Prostate High blood pressure Diverticulosis  Past Surgical History Jimmy Pearson(Jimmy Pearson K Raydon Chappuis, MD; 01/09/2016 12:16 PM) Open Inguinal Hernia Surgery Right. Colon Removal - Complete  Diagnostic Studies History Jimmy Pearson(Jimmy Pearson K Linsy Ehresman, MD; 01/09/2016 12:16 PM) Colonoscopy >10 years ago  Allergies (Jimmy Pearson,  Jimmy Pearson AM) No Known Drug Allergies 10/29/2015  Medication History (Jimmy Pearson, Jimmy Pearson AM) AmLODIPine Besylate (5MG  Tablet, Oral) Active. Benazepril HCl (20MG  Tablet, Oral) Active. Finasteride (5MG  Tablet, Oral) Active. Montelukast Sodium (10MG  Tablet, Oral) Active. Flonase (50MCG/ACT Suspension, Nasal) Active. Medications Reconciled  Social History Jimmy Pearson(Jimmy Pearson K Mashanda Ishibashi, MD; 01/09/2016 12:16 PM) Alcohol use Recently quit alcohol use. Caffeine use Coffee. No drug use Tobacco use Never smoker.  Family History Jimmy Pearson(Jimmy Pearson K Tremane Spurgeon, MD; 01/09/2016 12:16 PM) Colon Cancer Father. Prostate Cancer Father.    Vitals (Jimmy Pearson; 3/4/74259/05/2015 9:569:57 AM) 01/09/2016 9:57 AM Weight: 231 lb Height: 68in Body Surface Area: 2.17 m Body Mass Index: 35.12 kg/m  Temp.: 8F(Oral)  Pulse: 69 (Regular)  BP: 124/70 (Sitting, Left Arm, Standard)      Physical Exam Jimmy Pearson(Jimmy Pearson K. Hajime Asfaw MD; 01/09/2016 12:17 PM)  The physical exam findings are as follows: Note:WDWN in NAD Eyes: Pupils equal, round; sclera anicteric HENT: Oral mucosa moist; good dentition Neck: No masses palpated, no thyromegaly Lungs: CTA bilaterally; normal respiratory effort CV: Regular rate and rhythm; no murmurs; extremities well-perfused with no edema Abd: +bowel sounds, soft, non-tender, no palpable organomegaly; no palpable hernias; well-healed midline incision RLQ ileostomy - pink mucosal bud with no surrounding cellulitis Skin: Warm, dry; no sign of jaundice Psychiatric - alert and orientated x 4; calm mood and affect    Assessment & Plan Jimmy Pearson(Jimmy Terpstra K. Lasean Rahming MD; 01/09/2016 10:29 AM)  DIVERTICULITIS WITH OBSTRUCTION (K57.92)  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 The surgical procedure has been discussed with the patient. Potential risks, benefits, alternative treatments, and expected outcomes have been  explained. All of the  patient's questions at this time have been answered. The likelihood of reaching the patient's treatment goal is good. The patient understand the proposed surgical procedure and wishes to proceed.  Jimmy Pearson. [name], MD, [facility]  General/ Trauma Surgery  01/09/2016 12:17 PM

## 2016-02-04 ENCOUNTER — Other Ambulatory Visit (HOSPITAL_COMMUNITY): Payer: Self-pay | Admitting: *Deleted

## 2016-02-04 ENCOUNTER — Encounter (HOSPITAL_COMMUNITY): Payer: Self-pay

## 2016-02-04 ENCOUNTER — Encounter (HOSPITAL_COMMUNITY)
Admission: RE | Admit: 2016-02-04 | Discharge: 2016-02-04 | Disposition: A | Discharge status: Home / Self Care | Payer: Medicare Other | Source: Ambulatory Visit | Attending: Surgery | Admitting: Surgery

## 2016-02-04 DIAGNOSIS — Z9049 Acquired absence of other specified parts of digestive tract: Secondary | ICD-10-CM | POA: Insufficient documentation

## 2016-02-04 DIAGNOSIS — Z01812 Encounter for preprocedural laboratory examination: Secondary | ICD-10-CM | POA: Insufficient documentation

## 2016-02-04 DIAGNOSIS — K5792 Diverticulitis of intestine, part unspecified, without perforation or abscess without bleeding: Secondary | ICD-10-CM | POA: Insufficient documentation

## 2016-02-04 DIAGNOSIS — Z01818 Encounter for other preprocedural examination: Secondary | ICD-10-CM | POA: Diagnosis present

## 2016-02-04 HISTORY — DX: Sleep apnea, unspecified: G47.30

## 2016-02-04 HISTORY — DX: Bronchitis, not specified as acute or chronic: J40

## 2016-02-04 HISTORY — DX: Depression, unspecified: F32.A

## 2016-02-04 HISTORY — DX: Benign prostatic hyperplasia without lower urinary tract symptoms: N40.0

## 2016-02-04 HISTORY — DX: Major depressive disorder, single episode, unspecified: F32.9

## 2016-02-04 HISTORY — DX: Reserved for inherently not codable concepts without codable children: IMO0001

## 2016-02-04 LAB — TYPE AND SCREEN
ABO/RH(D): O NEG
Antibody Screen: NEGATIVE

## 2016-02-04 LAB — BASIC METABOLIC PANEL
ANION GAP: 9 (ref 5–15)
BUN: 17 mg/dL (ref 6–20)
CALCIUM: 9.9 mg/dL (ref 8.9–10.3)
CO2: 24 mmol/L (ref 22–32)
Chloride: 103 mmol/L (ref 101–111)
Creatinine, Ser: 0.99 mg/dL (ref 0.61–1.24)
GFR calc Af Amer: >60 mL/min (ref >60)
GFR calc non Af Amer: >60 mL/min (ref >60)
GLUCOSE: 107 mg/dL — AB (ref 65–99)
Potassium: 4.4 mmol/L (ref 3.5–5.1)
Sodium: 136 mmol/L (ref 135–145)

## 2016-02-04 LAB — CBC
HEMATOCRIT: 47.6 % (ref 39.0–52.0)
HEMOGLOBIN: 15.8 g/dL (ref 13.0–17.0)
MCH: 29.6 pg (ref 26.0–34.0)
MCHC: 33.2 g/dL (ref 30.0–36.0)
MCV: 89.1 fL (ref 78.0–100.0)
Platelets: 151 10*3/uL (ref 150–400)
RBC: 5.34 MIL/uL (ref 4.22–5.81)
RDW: 13.1 % (ref 11.5–15.5)
WBC: 5.5 10*3/uL (ref 4.0–10.5)

## 2016-02-04 LAB — ABO/RH: ABO/RH(D): O NEG

## 2016-02-04 NOTE — Progress Notes (Signed)
Pt denies cardiac history, chest pain or sob. Pt does not have a PCP at this time.

## 2016-02-04 NOTE — Pre-Procedure Instructions (Signed)
Jimmy Pearson  02/04/2016     Your procedure is scheduled on Wednesday, February 11, 2016 at 8:30 AM.   [facility] at 6:30 AM.   Call this number if you have problems the morning of surgery: [phone]   Questions prior to day of surgery, please call [phone] between 8 & 4 PM.   Remember:  Do not eat food or drink liquids after midnight Tuesday, 02/10/16.  Take these medicines the morning of surgery with A SIP OF WATER: Amlodipine (Norvasc)  Stop any herbal medications, do not use any NSAIDS (Ibuprofen, Aleve, etc.) or Aspirin products 7 days prior to surgery.   Do not wear jewelry.  Do not wear lotions, powders, or cologne.  Do not shave 48 hours prior to surgery.  Men may shave face and neck.  Do not bring valuables to the hospital.  [facility] is not responsible for any belongings or valuables.  Contacts, dentures or bridgework may not be worn into surgery.  Leave your suitcase in the car.  After surgery it may be brought to your room.  For patients admitted to the hospital, discharge time will be determined by your treatment team.  Special instructions:  [facility] - Preparing for Surgery  Before surgery, you can play an important role.  Because skin is not sterile, your skin needs to be as free of germs as possible.  You can reduce the number of germs on you skin by washing with CHG (chlorahexidine gluconate) soap before surgery.  CHG is an antiseptic cleaner which kills germs and bonds with the skin to continue killing germs even after washing.  Please DO NOT use if you have an allergy to CHG or antibacterial soaps.  If your skin becomes reddened/irritated stop using the CHG and inform your nurse when you arrive at Short Stay.  Do not shave (including legs and underarms) for at least 48 hours prior to the first CHG shower.  You may shave your face.  Please follow these instructions carefully:   1.  Shower with CHG  Soap the night before surgery and the                    morning of Surgery.  2.  If you choose to wash your hair, wash your hair first as usual with your       normal shampoo.  3.  After you shampoo, rinse your hair and body thoroughly to remove the shampoo.  4.  Use CHG as you would any other liquid soap.  You can apply chg directly       to the skin and wash gently with scrungie or a clean washcloth.  5.  Apply the CHG Soap to your body ONLY FROM THE NECK DOWN.        Do not use on open wounds or open sores.  Avoid contact with your eyes, ears, mouth and genitals (private parts).  Wash genitals (private parts) with your normal soap.  6.  Wash thoroughly, paying special attention to the area where your surgery        will be performed.  7.  Thoroughly rinse your body with warm water from the neck down.  8.  DO NOT shower/wash with your normal soap after using and rinsing off       the CHG Soap.  9.  Pat yourself dry with a clean towel.  10.  Wear clean pajamas.            11.  Place clean sheets on your bed the night of your first shower and do not        sleep with pets.  Day of Surgery  Do not apply any lotions the morning of surgery.  Please wear clean clothes to the hospital.   Please read over the following fact sheets that you were given. Pain Booklet, Coughing and Deep Breathing and Surgical Site Infection Prevention

## 2016-02-04 NOTE — Progress Notes (Signed)
   02/04/16 0945  OBSTRUCTIVE SLEEP APNEA  Have you ever been diagnosed with sleep apnea through a sleep study? No  Do you snore loudly (loud enough to be heard through closed doors)?  1  Do you often feel tired, fatigued, or sleepy during the daytime (such as falling asleep during driving or talking to someone)? 1  Has anyone observed you stop breathing during your sleep? 0  Do you have, or are you being treated for high blood pressure? 1  BMI more than 35 kg/m2? 1  Age > 50 (1-yes) 1  Neck circumference greater than:Male 16 inches or larger, Male 17inches or larger? 1  Male Gender (Yes=1) 1  Obstructive Sleep Apnea Score 7  Score 5 or greater  Results sent to PCP

## 2016-02-11 ENCOUNTER — Inpatient Hospital Stay (HOSPITAL_COMMUNITY): Payer: Medicare Other | Admitting: Certified Registered Nurse Anesthetist

## 2016-02-11 ENCOUNTER — Encounter (HOSPITAL_COMMUNITY)
Admission: RE | Disposition: A | Discharge status: Home / Self Care | Payer: Self-pay | Source: Ambulatory Visit | Attending: Surgery

## 2016-02-11 ENCOUNTER — Inpatient Hospital Stay (HOSPITAL_COMMUNITY)
Admission: RE | Admit: 2016-02-11 | Discharge: 2016-02-16 | DRG: 330 | Disposition: A | Discharge status: Home / Self Care | Payer: Medicare Other | Source: Ambulatory Visit | Attending: Surgery | Admitting: Surgery

## 2016-02-11 ENCOUNTER — Encounter (HOSPITAL_COMMUNITY): Payer: Self-pay | Admitting: Surgery

## 2016-02-11 DIAGNOSIS — K567 Ileus, unspecified: Secondary | ICD-10-CM | POA: Diagnosis not present

## 2016-02-11 DIAGNOSIS — Z432 Encounter for attention to ileostomy: Secondary | ICD-10-CM | POA: Diagnosis present

## 2016-02-11 DIAGNOSIS — I10 Essential (primary) hypertension: Secondary | ICD-10-CM | POA: Diagnosis present

## 2016-02-11 DIAGNOSIS — N400 Enlarged prostate without lower urinary tract symptoms: Secondary | ICD-10-CM | POA: Diagnosis present

## 2016-02-11 DIAGNOSIS — E669 Obesity, unspecified: Secondary | ICD-10-CM | POA: Diagnosis present

## 2016-02-11 DIAGNOSIS — Z6835 Body mass index (BMI) 35.0-35.9, adult: Secondary | ICD-10-CM | POA: Diagnosis not present

## 2016-02-11 DIAGNOSIS — Z9049 Acquired absence of other specified parts of digestive tract: Secondary | ICD-10-CM | POA: Diagnosis not present

## 2016-02-11 DIAGNOSIS — Z8042 Family history of malignant neoplasm of prostate: Secondary | ICD-10-CM

## 2016-02-11 DIAGNOSIS — G4730 Sleep apnea, unspecified: Secondary | ICD-10-CM | POA: Diagnosis present

## 2016-02-11 DIAGNOSIS — F329 Major depressive disorder, single episode, unspecified: Secondary | ICD-10-CM | POA: Diagnosis present

## 2016-02-11 DIAGNOSIS — Z79899 Other long term (current) drug therapy: Secondary | ICD-10-CM

## 2016-02-11 DIAGNOSIS — J9811 Atelectasis: Secondary | ICD-10-CM | POA: Diagnosis not present

## 2016-02-11 DIAGNOSIS — K660 Peritoneal adhesions (postprocedural) (postinfection): Secondary | ICD-10-CM | POA: Diagnosis present

## 2016-02-11 DIAGNOSIS — Z800 Family history of malignant neoplasm of digestive organs: Secondary | ICD-10-CM

## 2016-02-11 DIAGNOSIS — Z932 Ileostomy status: Secondary | ICD-10-CM

## 2016-02-11 HISTORY — PX: COLON RESECTION: SHX5231

## 2016-02-11 HISTORY — PX: ILEOSTOMY CLOSURE: SHX1784

## 2016-02-11 LAB — CBC
HCT: 47.7 % (ref 39.0–52.0)
Hemoglobin: 15.8 g/dL (ref 13.0–17.0)
MCH: 30 pg (ref 26.0–34.0)
MCHC: 33.1 g/dL (ref 30.0–36.0)
MCV: 90.7 fL (ref 78.0–100.0)
PLATELETS: 152 10*3/uL (ref 150–400)
RBC: 5.26 MIL/uL (ref 4.22–5.81)
RDW: 13.2 % (ref 11.5–15.5)
WBC: 12.6 10*3/uL — ABNORMAL HIGH (ref 4.0–10.5)

## 2016-02-11 LAB — CREATININE, SERUM
Creatinine, Ser: 1.19 mg/dL (ref 0.61–1.24)
GFR calc Af Amer: >60 mL/min (ref >60)
GFR calc non Af Amer: >60 mL/min (ref >60)

## 2016-02-11 SURGERY — COLON RESECTION LAPAROSCOPIC
Anesthesia: General | Site: Abdomen

## 2016-02-11 MED ORDER — PROPOFOL 10 MG/ML IV BOLUS
INTRAVENOUS | Status: DC | PRN
  Administered 2016-02-11: 160 mg via INTRAVENOUS

## 2016-02-11 MED ORDER — SODIUM CHLORIDE 0.9 % IR SOLN
Status: DC | PRN
  Administered 2016-02-11 (×2): 1000 mL

## 2016-02-11 MED ORDER — SUCCINYLCHOLINE CHLORIDE 20 MG/ML IJ SOLN
INTRAMUSCULAR | Status: DC | PRN
  Administered 2016-02-11: 120 mg via INTRAVENOUS

## 2016-02-11 MED ORDER — FINASTERIDE 5 MG PO TABS
5.0000 mg | ORAL_TABLET | Freq: Every day | ORAL | Status: DC
  Administered 2016-02-11 – 2016-02-15 (×5): 5 mg via ORAL
  Filled 2016-02-11 (×5): qty 1

## 2016-02-11 MED ORDER — ACETAMINOPHEN 325 MG PO TABS: 650.0000 mg | ORAL_TABLET | Freq: Four times a day (QID) | ORAL | Status: DC | PRN

## 2016-02-11 MED ORDER — DIPHENHYDRAMINE HCL 50 MG/ML IJ SOLN: 12.5000 mg | Freq: Four times a day (QID) | INTRAMUSCULAR | Status: DC | PRN

## 2016-02-11 MED ORDER — NALOXONE HCL 0.4 MG/ML IJ SOLN
0.4000 mg | INTRAMUSCULAR | Status: DC | PRN
Start: 2016-02-11 — End: 2016-02-12

## 2016-02-11 MED ORDER — LACTATED RINGERS IV SOLN: INTRAVENOUS | Status: DC

## 2016-02-11 MED ORDER — ONDANSETRON HCL 4 MG/2ML IJ SOLN
INTRAMUSCULAR | Status: DC | PRN
  Administered 2016-02-11: 4 mg via INTRAVENOUS

## 2016-02-11 MED ORDER — PROMETHAZINE HCL 25 MG/ML IJ SOLN: 6.2500 mg | INTRAMUSCULAR | Status: DC | PRN

## 2016-02-11 MED ORDER — MORPHINE SULFATE 2 MG/ML IV SOLN
INTRAVENOUS | Status: AC
  Filled 2016-02-11: qty 25

## 2016-02-11 MED ORDER — ONDANSETRON 4 MG PO TBDP: 4.0000 mg | ORAL_TABLET | Freq: Four times a day (QID) | ORAL | Status: DC | PRN

## 2016-02-11 MED ORDER — ENOXAPARIN SODIUM 40 MG/0.4ML ~~LOC~~ SOLN
40.0000 mg | SUBCUTANEOUS | Status: DC
  Administered 2016-02-12 – 2016-02-16 (×5): 40 mg via SUBCUTANEOUS
  Filled 2016-02-11 (×5): qty 0.4

## 2016-02-11 MED ORDER — CHLORHEXIDINE GLUCONATE CLOTH 2 % EX PADS: 6.0000 | MEDICATED_PAD | Freq: Once | CUTANEOUS | Status: DC

## 2016-02-11 MED ORDER — ONDANSETRON HCL 4 MG/2ML IJ SOLN
4.0000 mg | Freq: Four times a day (QID) | INTRAMUSCULAR | Status: DC | PRN
  Administered 2016-02-12: 4 mg via INTRAVENOUS
  Filled 2016-02-11: qty 2

## 2016-02-11 MED ORDER — METHOCARBAMOL 500 MG PO TABS
500.0000 mg | ORAL_TABLET | Freq: Four times a day (QID) | ORAL | Status: DC | PRN
  Administered 2016-02-11 – 2016-02-14 (×4): 500 mg via ORAL
  Filled 2016-02-11 (×4): qty 1

## 2016-02-11 MED ORDER — BUPIVACAINE-EPINEPHRINE (PF) 0.25% -1:200000 IJ SOLN
INTRAMUSCULAR | Status: AC
  Filled 2016-02-11: qty 30

## 2016-02-11 MED ORDER — PROPOFOL 10 MG/ML IV BOLUS
INTRAVENOUS | Status: AC
  Filled 2016-02-11: qty 40

## 2016-02-11 MED ORDER — ONDANSETRON HCL 4 MG/2ML IJ SOLN: 4.0000 mg | Freq: Four times a day (QID) | INTRAMUSCULAR | Status: DC | PRN

## 2016-02-11 MED ORDER — MEPERIDINE HCL 25 MG/ML IJ SOLN: 6.2500 mg | INTRAMUSCULAR | Status: DC | PRN

## 2016-02-11 MED ORDER — BUPIVACAINE-EPINEPHRINE 0.25% -1:200000 IJ SOLN
INTRAMUSCULAR | Status: DC | PRN
  Administered 2016-02-11: 6 mL

## 2016-02-11 MED ORDER — ROCURONIUM BROMIDE 100 MG/10ML IV SOLN
INTRAVENOUS | Status: DC | PRN
  Administered 2016-02-11 (×3): 10 mg via INTRAVENOUS
  Administered 2016-02-11: 30 mg via INTRAVENOUS
  Administered 2016-02-11: 20 mg via INTRAVENOUS
  Administered 2016-02-11: 40 mg via INTRAVENOUS

## 2016-02-11 MED ORDER — MONTELUKAST SODIUM 10 MG PO TABS
10.0000 mg | ORAL_TABLET | Freq: Every day | ORAL | Status: DC
  Administered 2016-02-11 – 2016-02-15 (×5): 10 mg via ORAL
  Filled 2016-02-11 (×5): qty 1

## 2016-02-11 MED ORDER — FENTANYL CITRATE (PF) 100 MCG/2ML IJ SOLN
INTRAMUSCULAR | Status: DC | PRN
  Administered 2016-02-11 (×4): 25 ug via INTRAVENOUS
  Administered 2016-02-11: 100 ug via INTRAVENOUS

## 2016-02-11 MED ORDER — BENAZEPRIL HCL 20 MG PO TABS
20.0000 mg | ORAL_TABLET | Freq: Every day | ORAL | Status: DC
  Administered 2016-02-11 – 2016-02-16 (×6): 20 mg via ORAL
  Filled 2016-02-11 (×6): qty 1

## 2016-02-11 MED ORDER — FLUTICASONE PROPIONATE 50 MCG/ACT NA SUSP
1.0000 | Freq: Every day | NASAL | Status: DC
  Administered 2016-02-11 – 2016-02-16 (×5): 1 via NASAL
  Filled 2016-02-11: qty 16

## 2016-02-11 MED ORDER — DIPHENHYDRAMINE HCL 12.5 MG/5ML PO ELIX: 12.5000 mg | ORAL_SOLUTION | Freq: Four times a day (QID) | ORAL | Status: DC | PRN

## 2016-02-11 MED ORDER — HYDROMORPHONE HCL 1 MG/ML IJ SOLN
INTRAMUSCULAR | Status: AC
  Filled 2016-02-11: qty 1

## 2016-02-11 MED ORDER — POTASSIUM CHLORIDE IN NACL 20-0.9 MEQ/L-% IV SOLN
INTRAVENOUS | Status: DC
  Administered 2016-02-11 – 2016-02-13 (×4): via INTRAVENOUS
  Administered 2016-02-13: 1 mL via INTRAVENOUS
  Administered 2016-02-14: 03:00:00 via INTRAVENOUS
  Filled 2016-02-11 (×6): qty 1000

## 2016-02-11 MED ORDER — LIDOCAINE HCL (CARDIAC) 20 MG/ML IV SOLN
INTRAVENOUS | Status: DC | PRN
  Administered 2016-02-11: 50 mg via INTRAVENOUS

## 2016-02-11 MED ORDER — CEFOTETAN DISODIUM-DEXTROSE 2-2.08 GM-% IV SOLR
2.0000 g | INTRAVENOUS | Status: AC
Start: 2016-02-11 — End: 2016-02-11
  Administered 2016-02-11: 2 g via INTRAVENOUS
  Filled 2016-02-11: qty 50

## 2016-02-11 MED ORDER — SODIUM CHLORIDE 0.9% FLUSH: 9.0000 mL | INTRAVENOUS | Status: DC | PRN

## 2016-02-11 MED ORDER — DEXTROSE 5 % IV SOLN
2.0000 g | Freq: Two times a day (BID) | INTRAVENOUS | Status: AC
  Administered 2016-02-11: 2 g via INTRAVENOUS
  Filled 2016-02-11 (×2): qty 2

## 2016-02-11 MED ORDER — MIDAZOLAM HCL 2 MG/2ML IJ SOLN
INTRAMUSCULAR | Status: AC
  Filled 2016-02-11: qty 2

## 2016-02-11 MED ORDER — MORPHINE SULFATE 2 MG/ML IV SOLN
INTRAVENOUS | Status: DC
  Administered 2016-02-11: 1.5 mg via INTRAVENOUS
  Administered 2016-02-11: 0 mg via INTRAVENOUS
  Administered 2016-02-11 – 2016-02-12 (×2): 3 mg via INTRAVENOUS
  Administered 2016-02-12 (×2): 1.5 mg via INTRAVENOUS

## 2016-02-11 MED ORDER — LACTATED RINGERS IV SOLN
INTRAVENOUS | Status: DC | PRN
  Administered 2016-02-11 (×2): via INTRAVENOUS

## 2016-02-11 MED ORDER — MIDAZOLAM HCL 5 MG/5ML IJ SOLN
INTRAMUSCULAR | Status: DC | PRN
  Administered 2016-02-11: 2 mg via INTRAVENOUS

## 2016-02-11 MED ORDER — ACETAMINOPHEN 650 MG RE SUPP: 650.0000 mg | Freq: Four times a day (QID) | RECTAL | Status: DC | PRN

## 2016-02-11 MED ORDER — HYDROMORPHONE HCL 1 MG/ML IJ SOLN
0.2500 mg | INTRAMUSCULAR | Status: DC | PRN
Start: 2016-02-11 — End: 2016-02-11
  Administered 2016-02-11 (×4): 0.5 mg via INTRAVENOUS

## 2016-02-11 MED ORDER — SUGAMMADEX SODIUM 200 MG/2ML IV SOLN
INTRAVENOUS | Status: DC | PRN
  Administered 2016-02-11: 200 mg via INTRAVENOUS

## 2016-02-11 MED ORDER — FENTANYL CITRATE (PF) 100 MCG/2ML IJ SOLN
INTRAMUSCULAR | Status: AC
  Filled 2016-02-11: qty 4

## 2016-02-11 MED ORDER — 0.9 % SODIUM CHLORIDE (POUR BTL) OPTIME
TOPICAL | Status: DC | PRN
  Administered 2016-02-11 (×3): 1000 mL

## 2016-02-11 MED ORDER — AMLODIPINE BESYLATE 5 MG PO TABS
5.0000 mg | ORAL_TABLET | Freq: Every day | ORAL | Status: DC
  Administered 2016-02-12 – 2016-02-16 (×5): 5 mg via ORAL
  Filled 2016-02-11 (×6): qty 1

## 2016-02-11 MED ORDER — PHENYLEPHRINE HCL 10 MG/ML IJ SOLN
INTRAMUSCULAR | Status: DC | PRN
  Administered 2016-02-11 (×2): 120 ug via INTRAVENOUS

## 2016-02-11 SURGICAL SUPPLY — 78 items
APPLIER CLIP 5 13 M/L LIGAMAX5 (MISCELLANEOUS)
APPLIER CLIP ROT 10 11.4 M/L (STAPLE)
BLADE SURG 10 STRL SS (BLADE) ×3 IMPLANT
BLADE SURG ROTATE 9660 (MISCELLANEOUS) ×3 IMPLANT
CANISTER SUCTION 2500CC (MISCELLANEOUS) ×3 IMPLANT
CELLS DAT CNTRL 66122 CELL SVR (MISCELLANEOUS) IMPLANT
CHLORAPREP W/TINT 26ML (MISCELLANEOUS) ×6 IMPLANT
CLIP APPLIE 5 13 M/L LIGAMAX5 (MISCELLANEOUS) IMPLANT
CLIP APPLIE ROT 10 11.4 M/L (STAPLE) IMPLANT
COVER MAYO STAND STRL (DRAPES) ×3 IMPLANT
COVER SURGICAL LIGHT HANDLE (MISCELLANEOUS) ×6 IMPLANT
DRAPE PROXIMA HALF (DRAPES) ×6 IMPLANT
DRAPE UTILITY XL STRL (DRAPES) ×3 IMPLANT
DRSG OPSITE POSTOP 4X10 (GAUZE/BANDAGES/DRESSINGS) IMPLANT
DRSG OPSITE POSTOP 4X8 (GAUZE/BANDAGES/DRESSINGS) ×3 IMPLANT
ELECT BLADE 6.5 EXT (BLADE) ×3 IMPLANT
ELECT CAUTERY BLADE 6.4 (BLADE) ×6 IMPLANT
ELECT REM PT RETURN 9FT ADLT (ELECTROSURGICAL) ×3
ELECTRODE REM PT RTRN 9FT ADLT (ELECTROSURGICAL) ×1 IMPLANT
GAUZE SPONGE 4X4 12PLY STRL (GAUZE/BANDAGES/DRESSINGS) ×3 IMPLANT
GEL ULTRASOUND 20GR AQUASONIC (MISCELLANEOUS) ×12 IMPLANT
GLOVE BIO SURGEON STRL SZ 6 (GLOVE) ×9 IMPLANT
GLOVE BIO SURGEON STRL SZ 6.5 (GLOVE) ×4 IMPLANT
GLOVE BIO SURGEON STRL SZ7 (GLOVE) ×9 IMPLANT
GLOVE BIO SURGEON STRL SZ7.5 (GLOVE) ×3 IMPLANT
GLOVE BIO SURGEON STRL SZ8.5 (GLOVE) ×3 IMPLANT
GLOVE BIO SURGEONS STRL SZ 6.5 (GLOVE) ×2
GLOVE BIOGEL PI IND STRL 6.5 (GLOVE) ×2 IMPLANT
GLOVE BIOGEL PI IND STRL 7.0 (GLOVE) ×1 IMPLANT
GLOVE BIOGEL PI IND STRL 7.5 (GLOVE) ×4 IMPLANT
GLOVE BIOGEL PI INDICATOR 6.5 (GLOVE) ×4
GLOVE BIOGEL PI INDICATOR 7.0 (GLOVE) ×2
GLOVE BIOGEL PI INDICATOR 7.5 (GLOVE) ×8
GLOVE SURG SS PI 7.0 STRL IVOR (GLOVE) ×6 IMPLANT
GOWN STRL REUS W/ TWL LRG LVL3 (GOWN DISPOSABLE) ×9 IMPLANT
GOWN STRL REUS W/TWL LRG LVL3 (GOWN DISPOSABLE) ×18
KIT BASIN OR (CUSTOM PROCEDURE TRAY) ×3 IMPLANT
KIT ROOM TURNOVER OR (KITS) ×3 IMPLANT
LEGGING LITHOTOMY PAIR STRL (DRAPES) ×3 IMPLANT
LIGASURE 5MM LAPAROSCOPIC (INSTRUMENTS) IMPLANT
LIGASURE IMPACT 36 18CM CVD LR (INSTRUMENTS) IMPLANT
MARKER SKIN DUAL TIP RULER LAB (MISCELLANEOUS) ×3 IMPLANT
NS IRRIG 1000ML POUR BTL (IV SOLUTION) ×9 IMPLANT
PAD ARMBOARD 7.5X6 YLW CONV (MISCELLANEOUS) ×6 IMPLANT
PENCIL BUTTON HOLSTER BLD 10FT (ELECTRODE) ×6 IMPLANT
RTRCTR WOUND ALEXIS 18CM MED (MISCELLANEOUS)
SCALPEL HARMONIC ACE (MISCELLANEOUS) ×3 IMPLANT
SCISSORS LAP 5X35 DISP (ENDOMECHANICALS) ×3 IMPLANT
SET IRRIG TUBING LAPAROSCOPIC (IRRIGATION / IRRIGATOR) ×6 IMPLANT
SLEEVE ENDOPATH XCEL 5M (ENDOMECHANICALS) ×6 IMPLANT
SPECIMEN JAR LARGE (MISCELLANEOUS) ×3 IMPLANT
STAPLER CIRC ILS CVD 25MM (STAPLE) ×3 IMPLANT
STAPLER VISISTAT 35W (STAPLE) ×3 IMPLANT
SURGILUBE 2OZ TUBE FLIPTOP (MISCELLANEOUS) ×3 IMPLANT
SUT PDS AB 1 CT  36 (SUTURE) ×6
SUT PDS AB 1 CT 36 (SUTURE) ×3 IMPLANT
SUT PDS AB 1 TP1 96 (SUTURE) ×6 IMPLANT
SUT PROLENE 2 0 CT2 30 (SUTURE) ×3 IMPLANT
SUT PROLENE 2 0 KS (SUTURE) IMPLANT
SUT SILK 2 0 SH CR/8 (SUTURE) ×3 IMPLANT
SUT SILK 2 0 TIES 10X30 (SUTURE) ×3 IMPLANT
SUT SILK 3 0 SH CR/8 (SUTURE) ×3 IMPLANT
SUT SILK 3 0 TIES 10X30 (SUTURE) ×3 IMPLANT
SYR BULB IRRIGATION 50ML (SYRINGE) ×6 IMPLANT
SYS LAPSCP GELPORT 120MM (MISCELLANEOUS) ×3
SYSTEM LAPSCP GELPORT 120MM (MISCELLANEOUS) ×1 IMPLANT
TOWEL OR 17X26 10 PK STRL BLUE (TOWEL DISPOSABLE) ×6 IMPLANT
TRAY FOLEY CATH 16FRSI W/METER (SET/KITS/TRAYS/PACK) ×3 IMPLANT
TRAY LAPAROSCOPIC MC (CUSTOM PROCEDURE TRAY) ×3 IMPLANT
TRAY PROCTOSCOPIC FIBER OPTIC (SET/KITS/TRAYS/PACK) ×3 IMPLANT
TROCAR XCEL 12X100 BLDLESS (ENDOMECHANICALS) IMPLANT
TROCAR XCEL BLUNT TIP 100MML (ENDOMECHANICALS) IMPLANT
TROCAR XCEL NON-BLD 11X100MML (ENDOMECHANICALS) IMPLANT
TROCAR XCEL NON-BLD 5MMX100MML (ENDOMECHANICALS) ×3 IMPLANT
TUBE CONNECTING 12'X1/4 (SUCTIONS) ×2
TUBE CONNECTING 12X1/4 (SUCTIONS) ×4 IMPLANT
TUBING FILTER THERMOFLATOR (ELECTROSURGICAL) ×3 IMPLANT
YANKAUER SUCT BULB TIP NO VENT (SUCTIONS) ×6 IMPLANT

## 2016-02-11 NOTE — H&P (Signed)
History of Present Illness  Patient words: postop.  The patient is a 65 year old male who presents with diverticulitis. This patient was seen urgently on 09/11/15 with a complete distal colon obstruction with massive dilation of the entire colon and pneumatosis of the cecum. 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 There were multiple serosal tears throughout the colon, especially in the cecum. There was no sign of gross contamination. He underwent subtotal colectomy with end ileostomy. Pathology confirmed that the mass was inflammatory and there is no sign of malignancy. He was discharged home on 09/16/15. He had a small fluid collection below the lower end of his incision which was evacuated. This area is being dressed with a dry dressing and is now completely healed. The ileostomy output depends on his diet, but he is able to maintain adequate hydration.Marland Kitchen. His urine output is clear and copious. He does not report any dry mouth. He is drinking Gatorade frequently. His appetite is improving.  The patient is doing very well. No abdominal pain. His ileostomy is functioning well but he has no issues with dehydration and hypovolemia. He is looking forward to having his ileostomy reversed. He is no longer have any issues with the pouch.   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 Colon Removal - Complete  Diagnostic Studies History Colonoscopy >10 years ago  Allergies  No Known Drug Allergies  Medication History  AmLODIPine Besylate (5MG  Tablet, Oral) Active. Benazepril HCl (20MG  Tablet, Oral) Active. Finasteride (5MG  Tablet, Oral) Active. Montelukast Sodium (10MG   Tablet, Oral) Active. Flonase (50MCG/ACT Suspension, Nasal) Active. Medications Reconciled  Social History  Alcohol use Recently quit alcohol use. Caffeine use Coffee. No drug use Tobacco use Never smoker.  Family History  Colon Cancer Father. Prostate Cancer Father.    Vitals  Weight: 231 lb Height: 68in Body Surface Area: 2.17 m Body Mass Index: 35.12 kg/m  Temp.: 68F(Oral)  Pulse: 69 (Regular)  BP: 124/70 (Sitting, Left Arm, Standard)      Physical Exam   The physical exam findings are as follows: Note:WDWN in NAD Eyes: Pupils equal, round; sclera anicteric HENT: Oral mucosa moist; good dentition Neck: No masses palpated, no thyromegaly Lungs: CTA bilaterally; normal respiratory effort CV: Regular rate and rhythm; no murmurs; extremities well-perfused with no edema Abd: +bowel sounds, soft, non-tender, no palpable organomegaly; no palpable hernias; well-healed midline incision RLQ ileostomy - pink mucosal bud with no surrounding cellulitis Skin: Warm, dry; no sign of jaundice Psychiatric - alert and orientated x 4; calm mood and affect    Assessment & Plan   DIVERTICULITIS WITH OBSTRUCTION (K57.92)  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 The surgical procedure has been discussed with the patient. Potential risks, benefits, alternative treatments, and expected outcomes have been explained. All of the patient's questions at this time have been answered. The likelihood of reaching the patient's treatment goal is good. The patient understand the proposed surgical procedure and wishes to proceed.  Wilmon ArmsMatthew K. Corliss Skainssuei, MD, [facility]  General/ Trauma Surgery  02/11/2016 7:48 AM

## 2016-02-11 NOTE — Anesthesia Postprocedure Evaluation (Signed)
Anesthesia Post Note  Patient: Radene OuLarry Kavanaugh  Procedure(s) Performed: Procedure(s) (LRB): LAPAROSCOPIC ASSISTED ILEOSTOMY CLOSURE (N/A)  Patient location during evaluation: PACU Anesthesia Type: General Level of consciousness: awake and alert Pain management: pain level controlled Vital Signs Assessment: post-procedure vital signs reviewed and stable Respiratory status: spontaneous breathing, nonlabored ventilation, respiratory function stable and patient connected to nasal cannula oxygen Cardiovascular status: blood pressure returned to baseline and stable Postop Assessment: no signs of nausea or vomiting Anesthetic complications: no    Last Vitals:  Vitals:   02/11/16 1338 02/11/16 1359  BP:  134/82  Pulse:  77  Resp:  15  Temp: 36.5 C 36.9 C    Last Pain:  Vitals:   02/11/16 1359  TempSrc: Oral  PainSc:                  Shelton SilvasKevin D Jaisa Defino

## 2016-02-11 NOTE — Transfer of Care (Signed)
Immediate Anesthesia Transfer of Care Note  Patient: Jimmy Pearson  Procedure(s) Performed: Procedure(s): LAPAROSCOPIC ASSISTED ILEOSTOMY CLOSURE (N/A)  Patient Location: PACU  Anesthesia Type:General  Level of Consciousness: sedated  Airway & Oxygen Therapy: Patient Spontanous Breathing, Patient connected to nasal cannula oxygen and 34 Fr nasal airway left nare  Post-op Assessment: Report given to RN, Post -op Vital signs reviewed and stable and Patient moving all extremities  Post vital signs: Reviewed and stable  Last Vitals:  Vitals:   02/11/16 0640  BP: (!) 178/109  Pulse: 88  Resp: 20  Temp: 36.8 C    Last Pain:  Vitals:   02/11/16 0640  TempSrc: Oral      Patients Stated Pain Goal: 4 (02/11/16 0738)  Complications: No apparent anesthesia complications

## 2016-02-11 NOTE — Op Note (Signed)
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 Assistant: Dr. Phylliss Blakes Anesthesia: General endotracheal Indications: This is a 65 year old male who is 5 months status post urgent subtotal colectomy for an obstructing colon mass with perforation of the cecum.  The patient was quite ill and underwent emergent subtotal colectomy.  Pathology confirmed that the mass was due to diverticulitis.  He has had an end ileostomy and has done quite well.  He would like to have this reversed.  Description of procedure: The patient is brought to the operating room and placed in the supine position on the operating room table.  After an adequate level of general anesthesia was obtained, a Foley catheter was placed under sterile technique.  His legs were placed in lithotomy position in yellowfin stirrups.  His perineum was prepped with Betadine.  His abdomen was prepped with ChloraPrep.  We draped in sterile fashion.  A timeout was taken to ensure the proper patient and proper procedure.  We infiltrated the area below the left costal margin with 0.25% Marcaine with epinephrine. A 5 mm Optiview trocar was then used to cannulate the peritoneal cavity.  We insufflated CO2 maintaining a maximum pressure of 15 mmHg.  The laparoscope was inserted.  The patient has a lot of omental adhesions to his midline incision.  However there is a clear space on either side of the midline.  We inserted 2 additional 5 mm ports on the left side.  The Harmonic Scalpel was used to take down the omental adhesions.  We identified the terminal ileum heading up to the ileostomy.  We then placed the patient in Trendelenburg position.  We tried to mobilize the small bowel out of the pelvis.  There is one loop of small bowel that is densely  We carefully examined this area.  This seems to be  adherent to the rectal stump as we can see the 2 blue Prolene sutures.  We attempted to bluntly dissect the bowel away from the rectal stump but were unsuccessful.  We made the decision to make a 7 cm lower midline incision and to use a hand port.  Sing the GelPort, I was able to lyse these adhesions and mobilize small bowel out of the pelvis.  We examined the small bowel and oversewed some serosal defect with 3-0 silk sutures.  We then took down the ileostomy by making an elliptical incision around the ileostomy.  We dissected down around the ileostomy through the subcutaneous tissues to the fascia.  We mobilized the ileostomy away from the surrounding fascia.  We freed up the small bowel.  We amputated the distal 3 cm of the terminal ileum and sent this as old ileostomy.  We used an EEA sizer to select a 25 mm EEA stapler.  A pursestring suture of 2-0 Prolene was placed in the end of the terminal ileum.  We closed this around the shaft of the EEA anvil.  We placed this back in the peritoneal cavity.  We then brought this out through the lower midline incision.  We lysed some interloop adhesions to mobilize the terminal ileum so reached down into the pelvis.  Once we were happy with our mobilization we replaced the GelPort and reinsufflated CO2.  The patient's rectum was dilated up to 3 fingers.  The 25 mm EEA stapler was inserted and advanced to the end of the rectal stump.  The spike of the stapler was advanced  just posterior to the staple line.  This anvil was connected to the spike of the stapler.  We closed the stapler, held pressure for several minutes, and fired stapler.  The stapler was removed.  There are 2 complete tissue donuts within the stapler.  We filled the pelvis with saline.  I inserted the rigid sigmoidoscope and insufflated air.  No air leak is noted.  The anastomosis appears to be patent with no sign of bleeding.  We released our insufflation from the rectum.  We irrigated the abdomen  thoroughly.  We changed gloves and gowns in accordance with the colon protocol.  The fascia of the ileostomy site was closed with #1 PDS.  Double-stranded #1 PDS was used to close the midline fascia.  The subcutaneous tissues were irrigated.  Staples were used to close the skin.  We loosely closed the skin of the ileostomy site.  The patient was then extubated and brought to the recovery room in stable condition.  All sponge, instrument and needle counts are correct.  Wilmon ArmsMatthew K. Corliss Skainssuei, MD, [facility]  General/ Trauma Surgery  02/11/2016 5:31 PM

## 2016-02-11 NOTE — Progress Notes (Signed)
Visited pt room ask if he wears Cpap he stated he wears one at home but he didn't care for ours during his last visit and does not want to wear tonight.  Advised patient if he changes his mind to let me know.

## 2016-02-11 NOTE — Anesthesia Preprocedure Evaluation (Addendum)
Anesthesia Evaluation  Patient identified by MRN, date of birth, ID band Patient awake    Reviewed: Allergy & Precautions, NPO status , Patient's Chart, lab work & pertinent test results  Airway Mallampati: III   Neck ROM: Full  Mouth opening: Limited Mouth Opening  Dental  (+) Teeth Intact, Dental Advisory Given   Pulmonary sleep apnea ,    breath sounds clear to auscultation       Cardiovascular hypertension, Pt. on medications  Rhythm:Regular Rate:Normal     Neuro/Psych PSYCHIATRIC DISORDERS Depression negative neurological ROS     GI/Hepatic negative GI ROS, Neg liver ROS,   Endo/Other  negative endocrine ROS  Renal/GU negative Renal ROS  negative genitourinary   Musculoskeletal negative musculoskeletal ROS (+)   Abdominal   Peds negative pediatric ROS (+)  Hematology negative hematology ROS (+)   Anesthesia Other Findings   Reproductive/Obstetrics negative OB ROS                            09/2015 EKG: normal sinus rhythm.  09/2015 Echo - Left ventricle: The cavity size was normal. Wall thickness was   increased in a pattern of mild LVH. There was mild focal basal   hypertrophy of the septum. Systolic function was vigorous. The   estimated ejection fraction was in the range of 65% to 70%. Wall   motion was normal; there were no regional wall motion   abnormalities. Doppler parameters are consistent with abnormal   left ventricular relaxation (grade 1 diastolic dysfunction). - Aortic valve: Valve mobility was restricted. - Mitral valve: Calcified annulus.  Anesthesia Physical Anesthesia Plan  ASA: II  Anesthesia Plan: General   Post-op Pain Management:    Induction: Intravenous  Airway Management Planned: Oral ETT  Additional Equipment:   Intra-op Plan:   Post-operative Plan: Extubation in OR  Informed Consent: I have reviewed the patients History and Physical,  chart, labs and discussed the procedure including the risks, benefits and alternatives for the proposed anesthesia with the patient or authorized representative who has indicated his/her understanding and acceptance.   Dental advisory given  Plan Discussed with: CRNA  Anesthesia Plan Comments:         Anesthesia Quick Evaluation

## 2016-02-11 NOTE — Anesthesia Procedure Notes (Signed)
Procedure Name: Intubation Date/Time: 02/11/2016 9:11 AM Performed by: Lodema HongSIMPSON, Alakai Macbride ANN Pre-anesthesia Checklist: Patient identified, Emergency Drugs available, Suction available and Patient being monitored Patient Re-evaluated:Patient Re-evaluated prior to inductionOxygen Delivery Method: Circle system utilized Preoxygenation: Pre-oxygenation with 100% oxygen Intubation Type: IV induction Ventilation: Two handed mask ventilation required and Oral airway inserted - appropriate to patient size Laryngoscope Size: Mac and 3 Grade View: Grade II Tube type: Oral Tube size: 7.5 mm Number of attempts: 1 Placement Confirmation: positive ETCO2 and breath sounds checked- equal and bilateral Secured at: 23 cm Tube secured with: Tape Difficulty Due To: Difficult Airway- due to anterior larynx Future Recommendations: Recommend- induction with short-acting agent, and alternative techniques readily available

## 2016-02-12 ENCOUNTER — Encounter (HOSPITAL_COMMUNITY): Payer: Self-pay | Admitting: Surgery

## 2016-02-12 LAB — CBC
HCT: 45.1 % (ref 39.0–52.0)
Hemoglobin: 14.4 g/dL (ref 13.0–17.0)
MCH: 29.8 pg (ref 26.0–34.0)
MCHC: 31.9 g/dL (ref 30.0–36.0)
MCV: 93.2 fL (ref 78.0–100.0)
PLATELETS: 154 10*3/uL (ref 150–400)
RBC: 4.84 MIL/uL (ref 4.22–5.81)
RDW: 13.7 % (ref 11.5–15.5)
WBC: 11 10*3/uL — ABNORMAL HIGH (ref 4.0–10.5)

## 2016-02-12 LAB — BASIC METABOLIC PANEL
Anion gap: 10 (ref 5–15)
BUN: 12 mg/dL (ref 6–20)
CO2: 30 mmol/L (ref 22–32)
CREATININE: 0.99 mg/dL (ref 0.61–1.24)
Calcium: 8.2 mg/dL — ABNORMAL LOW (ref 8.9–10.3)
Chloride: 98 mmol/L — ABNORMAL LOW (ref 101–111)
GFR calc Af Amer: >60 mL/min (ref >60)
Glucose, Bld: 126 mg/dL — ABNORMAL HIGH (ref 65–99)
Potassium: 4.1 mmol/L (ref 3.5–5.1)
SODIUM: 138 mmol/L (ref 135–145)

## 2016-02-12 MED ORDER — MORPHINE SULFATE (PF) 2 MG/ML IV SOLN
2.0000 mg | INTRAVENOUS | Status: DC | PRN
  Administered 2016-02-12: 2 mg via INTRAVENOUS
  Filled 2016-02-12: qty 1

## 2016-02-12 MED ORDER — OXYCODONE HCL 5 MG PO TABS
5.0000 mg | ORAL_TABLET | ORAL | Status: DC | PRN
  Administered 2016-02-12 – 2016-02-13 (×2): 10 mg via ORAL
  Filled 2016-02-12 (×3): qty 2

## 2016-02-12 MED ORDER — LORAZEPAM 1 MG PO TABS
1.0000 mg | ORAL_TABLET | Freq: Three times a day (TID) | ORAL | Status: DC | PRN
  Administered 2016-02-12: 1 mg via ORAL
  Filled 2016-02-12: qty 1

## 2016-02-12 NOTE — Progress Notes (Signed)
1 Day Post-Op  Subjective: Patient is reasonably comfortable - using PCA sparingly Foley removed - has not yet voided No nausea No flatus or BM  Objective: Vital signs in last 24 hours: Temp:  [97.7 F (36.5 C)-98.4 F (36.9 C)] 98.1 F (36.7 C) (10/05 0534) Pulse Rate:  [71-105] 82 (10/05 0534) Resp:  [7-21] 12 (10/05 0534) BP: (110-162)/(72-102) 110/72 (10/05 0534) SpO2:  [75 %-100 %] 93 % (10/05 0534) Last BM Date: 02/09/16  Intake/Output from previous day: 10/04 0701 - 10/05 0700 In: 2517.4 [P.O.:120; I.V.:2397.4] Out: 1760 [Urine:1710; Blood:50] Intake/Output this shift: No intake/output data recorded.  General appearance: alert, cooperative and no distress Resp: clear to auscultation bilaterally Cardio: regular rate and rhythm, S1, S2 normal, no murmur, click, rub or gallop GI: Minimal bowel sounds, mildly distended; incisional tenderness RLQ ostomy site - some old bloody drainage; other incisions c/d/i Some skin irritation around tip of penis after Foley removal  Lab Results:   Recent Labs  02/11/16 1444 02/12/16 0438  WBC 12.6* 11.0*  HGB 15.8 14.4  HCT 47.7 45.1  PLT 152 154   BMET  Recent Labs  02/11/16 1444 02/12/16 0438  NA  --  138  Pearson  --  4.1  CL  --  98*  CO2  --  30  GLUCOSE  --  126*  BUN  --  12  CREATININE 1.19 0.99  CALCIUM  --  8.2*   PT/INR No results for input(s): LABPROT, INR in the last 72 hours. ABG No results for input(s): PHART, HCO3 in the last 72 hours.  Invalid input(s): PCO2, PO2  Studies/Results: No results found.  Anti-infectives: Anti-infectives    Start     Dose/Rate Route Frequency Ordered Stop   02/11/16 2100  cefoTEtan (CEFOTAN) 2 g in dextrose 5 % 50 mL IVPB     2 g 100 mL/hr over 30 Minutes Intravenous Every 12 hours 02/11/16 1417 02/11/16 2229   02/11/16 0553  cefoTEtan in Dextrose 5% (CEFOTAN) IVPB 2 g     2 g Intravenous On call to O.R. 02/11/16 0553 02/11/16 0850      Assessment/Plan: s/p  Procedure(s): LAPAROSCOPIC ASSISTED ILEOSTOMY CLOSURE (N/A) Continue clears  Encourage ambulation Abdominal binder Antibiotic ointment to urethral meatus D/C PCA - po pain meds Awaiting bowel function  LOS: 1 day    Jimmy Pearson. 02/12/2016

## 2016-02-13 LAB — CBC
HEMATOCRIT: 42.9 % (ref 39.0–52.0)
HEMOGLOBIN: 13.7 g/dL (ref 13.0–17.0)
MCH: 30 pg (ref 26.0–34.0)
MCHC: 31.9 g/dL (ref 30.0–36.0)
MCV: 94.1 fL (ref 78.0–100.0)
Platelets: 137 10*3/uL — ABNORMAL LOW (ref 150–400)
RBC: 4.56 MIL/uL (ref 4.22–5.81)
RDW: 13.7 % (ref 11.5–15.5)
WBC: 9.4 10*3/uL (ref 4.0–10.5)

## 2016-02-13 LAB — BASIC METABOLIC PANEL
ANION GAP: 8 (ref 5–15)
BUN: 10 mg/dL (ref 6–20)
CALCIUM: 8.5 mg/dL — AB (ref 8.9–10.3)
CO2: 30 mmol/L (ref 22–32)
Chloride: 101 mmol/L (ref 101–111)
Creatinine, Ser: 0.86 mg/dL (ref 0.61–1.24)
GLUCOSE: 119 mg/dL — AB (ref 65–99)
POTASSIUM: 3.9 mmol/L (ref 3.5–5.1)
Sodium: 139 mmol/L (ref 135–145)

## 2016-02-13 MED ORDER — OXYCODONE HCL 5 MG PO TABS: 5.0000 mg | ORAL_TABLET | ORAL | 0 refills | Status: AC | PRN

## 2016-02-13 NOTE — Progress Notes (Signed)
2 Days Post-Op  Subjective: Patient resting comfortably No BM or flatus Mild nausea Pain improved  Objective: Vital signs in last 24 hours: Temp:  [98.2 F (36.8 C)-99.3 F (37.4 C)] 98.3 F (36.8 C) (10/06 0700) Pulse Rate:  [72-88] 88 (10/06 0700) Resp:  [16-19] 19 (10/06 0700) BP: (117-129)/(64-79) 122/79 (10/06 0700) SpO2:  [86 %-98 %] 98 % (10/06 0700) Last BM Date: 02/09/16  Intake/Output from previous day: 10/05 0701 - 10/06 0700 In: 1173.8 [P.O.:360; I.V.:813.8] Out: 180 [Urine:180] Intake/Output this shift: No intake/output data recorded.  General appearance: alert, cooperative and no distress Resp: clear to auscultation bilaterally Cardio: regular rate and rhythm, S1, S2 normal, no murmur, click, rub or gallop GI: mildly distended; + Bowel sounds; incision c/d/i; RLQ ileostomy site with some old bloody drainage but no surrounding cellulitis Penis - no bleeding  Lab Results:   Recent Labs  02/12/16 0438 02/13/16 0505  WBC 11.0* 9.4  HGB 14.4 13.7  HCT 45.1 42.9  PLT 154 137*   BMET  Recent Labs  02/12/16 0438 02/13/16 0505  NA 138 139  K 4.1 3.9  CL 98* 101  CO2 30 30  GLUCOSE 126* 119*  BUN 12 10  CREATININE 0.99 0.86  CALCIUM 8.2* 8.5*   PT/INR No results for input(s): LABPROT, INR in the last 72 hours. ABG No results for input(s): PHART, HCO3 in the last 72 hours.  Invalid input(s): PCO2, PO2  Studies/Results: No results found.  Anti-infectives: Anti-infectives    Start     Dose/Rate Route Frequency Ordered Stop   02/11/16 2100  cefoTEtan (CEFOTAN) 2 g in dextrose 5 % 50 mL IVPB     2 g 100 mL/hr over 30 Minutes Intravenous Every 12 hours 02/11/16 1417 02/11/16 2229   02/11/16 0553  cefoTEtan in Dextrose 5% (CEFOTAN) IVPB 2 g     2 g Intravenous On call to O.R. 02/11/16 0553 02/11/16 0850      Assessment/Plan: s/p Procedure(s): LAPAROSCOPIC ASSISTED ILEOSTOMY CLOSURE (N/A)  POD #2 s/p ileorectal anastomosis Encourage  ambulation  Abdominal binder Continue clears until some bowel function  LOS: 2 days    Quintara Bost K. 02/13/2016

## 2016-02-13 NOTE — Progress Notes (Signed)
Pt refused cpap

## 2016-02-13 NOTE — Progress Notes (Signed)
Patient refused CPAP tonight. There Isn't a machine in the room at this time. RN aware. Explained to Patient that if they changed their mind, to just have the RN call Respiratory and we would come set them up. 

## 2016-02-14 MED ORDER — MORPHINE SULFATE (PF) 2 MG/ML IV SOLN: 1.0000 mg | INTRAVENOUS | Status: DC | PRN

## 2016-02-14 MED ORDER — HYDROCODONE-ACETAMINOPHEN 5-325 MG PO TABS
1.0000 | ORAL_TABLET | ORAL | Status: DC | PRN
  Administered 2016-02-14 – 2016-02-15 (×5): 2 via ORAL
  Administered 2016-02-16: 1 via ORAL
  Administered 2016-02-16: 2 via ORAL
  Filled 2016-02-14 (×4): qty 2
  Filled 2016-02-14: qty 1
  Filled 2016-02-14 (×2): qty 2

## 2016-02-14 NOTE — Progress Notes (Signed)
Patient refused CPAP tonight. There Isn't a machine in the room at this time. RN aware. Explained to Patient that if they changed their mind, to just have the RN call Respiratory and we would come set them up. 

## 2016-02-14 NOTE — Progress Notes (Signed)
3 Days Post-Op  Subjective: Alert.  Begging for regular food.  Says he's very hungry.  Has had several loose stools.  Voiding without difficulty.  Ambulating in hall.  Pain well controlled.  Temp to 101.3.  Vital signs stable  Objective: Vital signs in last 24 hours: Temp:  [97.3 F (36.3 C)-101.3 F (38.5 C)] 99.1 F (37.3 C) (10/07 0634) Pulse Rate:  [79-94] 84 (10/07 0634) Resp:  [17-19] 19 (10/07 0634) BP: (115-146)/(69-78) 132/75 (10/07 0634) SpO2:  [94 %-100 %] 95 % (10/07 0634) Last BM Date: 02/09/16  Intake/Output from previous day: 10/06 0701 - 10/07 0700 In: 2955 [P.O.:1080; I.V.:1875] Out: 300 [Urine:300] Intake/Output this shift: Total I/O In: 140 [I.V.:140] Out: -   General appearance: Alert.  Comfortable.  No distress.  Very animated and talkative. Resp: clear to auscultation bilaterally GI: Soft.  Obese.  Not distended.  Minimal tenderness.  Wounds look fine.  Some bowel sounds present.  Lab Results:  No results found for this or any previous visit (from the past 24 hour(s)).   Studies/Results: No results found.  Marland Kitchen. amLODipine  5 mg Oral Daily  . benazepril  20 mg Oral Daily  . enoxaparin (LOVENOX) injection  40 mg Subcutaneous Q24H  . finasteride  5 mg Oral q1800  . fluticasone  1 spray Each Nare Daily  . montelukast  10 mg Oral QHS     Assessment/Plan: s/p Procedure(s): LAPAROSCOPIC ASSISTED ILEOSTOMY CLOSURE  POD #3.  Hand-assisted ileostomy closure with ileorectal anastomosis Ileus resolving Advance diet Decrease IV and narcotics Hopefully home in 1-2 days Check labs tomorrow   Fever.  Likely atelectasis.  I stressed the importance of ambulation and use of incentive spirometer    @PROBHOSP @  LOS: 3 days    Angelina Neece M 02/14/2016  . .prob

## 2016-02-15 LAB — BASIC METABOLIC PANEL
Anion gap: 4 — ABNORMAL LOW (ref 5–15)
BUN: 13 mg/dL (ref 6–20)
CALCIUM: 8.5 mg/dL — AB (ref 8.9–10.3)
CHLORIDE: 102 mmol/L (ref 101–111)
CO2: 32 mmol/L (ref 22–32)
CREATININE: 0.84 mg/dL (ref 0.61–1.24)
GFR calc non Af Amer: >60 mL/min (ref >60)
Glucose, Bld: 97 mg/dL (ref 65–99)
Potassium: 3.8 mmol/L (ref 3.5–5.1)
SODIUM: 138 mmol/L (ref 135–145)

## 2016-02-15 LAB — CBC
HCT: 38.2 % — ABNORMAL LOW (ref 39.0–52.0)
Hemoglobin: 12.1 g/dL — ABNORMAL LOW (ref 13.0–17.0)
MCH: 29.7 pg (ref 26.0–34.0)
MCHC: 31.7 g/dL (ref 30.0–36.0)
MCV: 93.6 fL (ref 78.0–100.0)
PLATELETS: 134 10*3/uL — AB (ref 150–400)
RBC: 4.08 MIL/uL — AB (ref 4.22–5.81)
RDW: 13.4 % (ref 11.5–15.5)
WBC: 5 10*3/uL (ref 4.0–10.5)

## 2016-02-15 NOTE — Progress Notes (Addendum)
4 Days Post-Op  Subjective: Doing fairly well.  Is eating solid diet.  Having loose bowel movements. Ambulating some. Voiding without difficulty Pain controlled  WBC 5000.  Hemoglobin 12.1.  Electrolytes normal.  He's just about ready to go home.  He declines discharge today stating that he wants to meet with dietitian about dietary advice and nutrition considering his subtotal colectomy. He also wants to meet with Dr. Corliss Skainssuei tomorrow. He certainly should be ready to go home tomorrow.  Objective: Vital signs in last 24 hours: Temp:  [97.8 F (36.6 C)-99.3 F (37.4 C)] 97.8 F (36.6 C) (10/08 0602) Pulse Rate:  [55-77] 55 (10/08 0602) Resp:  [17-18] 17 (10/08 0602) BP: (111-118)/(53-88) 118/88 (10/08 0602) SpO2:  [99 %-100 %] 100 % (10/08 0602) Last BM Date: 02/14/16  Intake/Output from previous day: 10/07 0701 - 10/08 0700 In: 1136.3 [P.O.:370; I.V.:766.3] Out: 250 [Urine:250] Intake/Output this shift: Total I/O In: 250 [P.O.:250] Out: -    General appearance: Alert.  Comfortable.  No distress.  Very animated and talkative. Resp: clear to auscultation bilaterally GI: Soft.  Obese.  Not distended.  Minimal tenderness.  Wounds look fine.  Dressings changed.   Some bowel sounds present.    Lab Results:  Results for orders placed or performed during the hospital encounter of 02/11/16 (from the past 24 hour(s))  Basic metabolic panel     Status: Abnormal   Collection Time: 02/15/16  2:21 AM  Result Value Ref Range   Sodium 138 135 - 145 mmol/L   Potassium 3.8 3.5 - 5.1 mmol/L   Chloride 102 101 - 111 mmol/L   CO2 32 22 - 32 mmol/L   Glucose, Bld 97 65 - 99 mg/dL   BUN 13 6 - 20 mg/dL   Creatinine, Ser 0.100.84 0.61 - 1.24 mg/dL   Calcium 8.5 (L) 8.9 - 10.3 mg/dL   GFR calc non Af Amer >60 >60 mL/min   GFR calc Af Amer >60 >60 mL/min   Anion gap 4 (L) 5 - 15  CBC     Status: Abnormal   Collection Time: 02/15/16  2:21 AM  Result Value Ref Range   WBC 5.0 4.0 - 10.5  K/uL   RBC 4.08 (L) 4.22 - 5.81 MIL/uL   Hemoglobin 12.1 (L) 13.0 - 17.0 g/dL   HCT 27.238.2 (L) 53.639.0 - 64.452.0 %   MCV 93.6 78.0 - 100.0 fL   MCH 29.7 26.0 - 34.0 pg   MCHC 31.7 30.0 - 36.0 g/dL   RDW 03.413.4 74.211.5 - 59.515.5 %   Platelets 134 (L) 150 - 400 K/uL     Studies/Results: No results found.  Marland Kitchen. amLODipine  5 mg Oral Daily  . benazepril  20 mg Oral Daily  . enoxaparin (LOVENOX) injection  40 mg Subcutaneous Q24H  . finasteride  5 mg Oral q1800  . fluticasone  1 spray Each Nare Daily  . montelukast  10 mg Oral QHS     Assessment/Plan: s/p Procedure(s): LAPAROSCOPIC ASSISTED ILEOSTOMY CLOSURE   POD #4.  Hand-assisted ileostomy closure with ileorectal anastomosis Ileus resolving Wound care instructions given to nursing.  May shower. Consult from nutritional services for diet advice Plan discharge tomorrow   Fever.  Likely atelectasis.  resolved   @PROBHOSP @  LOS: 4 days    Jimmy Pearson M 02/15/2016  . .prob

## 2016-02-15 NOTE — Progress Notes (Signed)
Patient refused CPAP tonight. There Isn't a machine in the room at this time. RN aware. Explained to Patient that if they changed their mind, to just have the RN call Respiratory and we would come set them up. 

## 2016-02-16 NOTE — Progress Notes (Signed)
Pt Staples (4) on lap sites removed. Pt Discharge. Discharge paper reviewed and he understand. Supplies sent home for dressing changes.

## 2016-02-16 NOTE — Care Management Important Message (Signed)
Important Message  Patient Details  Name: Jimmy Pearson MRN: [id number] Date of Birth: 04/04/1951   Medicare Important Message Given:  Yes    Dorena BodoIris Storm Sovine 02/16/2016, 12:21 PM

## 2016-02-16 NOTE — Discharge Instructions (Signed)
Recommendation for Primary Care Physicians Dr. Blair Heys - [facility]   CCS      [facility], [place] [phone]  OPEN ABDOMINAL SURGERY: POST OP INSTRUCTIONS  Always review your discharge instruction sheet given to you by the facility where your surgery was performed.  IF YOU HAVE DISABILITY OR FAMILY LEAVE FORMS, YOU MUST BRING THEM TO THE OFFICE FOR PROCESSING.  PLEASE DO NOT GIVE THEM TO YOUR DOCTOR.  1. A prescription for pain medication may be given to you upon discharge.  Take your pain medication as prescribed, if needed.  If narcotic pain medicine is not needed, then you may take acetaminophen (Tylenol) or ibuprofen (Advil) as needed. 2. Take your usually prescribed medications unless otherwise directed. 3. If you need a refill on your pain medication, please contact your pharmacy. They will contact our office to request authorization.  Prescriptions will not be filled after 5pm or on week-ends. 4. You should follow a light diet the first few days after arrival home, such as soup and crackers, pudding, etc.unless your doctor has advised otherwise. A high-fiber, low fat diet can be resumed as tolerated.   Be sure to include lots of fluids daily. Most patients will experience some swelling and bruising on the chest and neck area.  Ice packs will help.  Swelling and bruising can take several days to resolve 5. Most patients will experience some swelling and bruising in the area of the incision. Ice pack will help. Swelling and bruising can take several days to resolve..  6. It is common to experience some constipation if taking pain medication after surgery.  Increasing fluid intake and taking a stool softener will usually help or prevent this problem from occurring.  A mild laxative (Milk of Magnesia or Miralax) should be taken according to package directions if there are no bowel movements after 48 hours. 7.  You may have steri-strips (small skin tapes) in place  directly over the incision.  These strips should be left on the skin for 7-10 days.  If your surgeon used skin glue on the incision, you may shower in 24 hours.  The glue will flake off over the next 2-3 weeks.  Any sutures or staples will be removed at the office during your follow-up visit. You may find that a light gauze bandage over your incision may keep your staples from being rubbed or pulled. You may shower and replace the bandage daily. 8. ACTIVITIES:  You may resume regular (light) daily activities beginning the next day--such as daily self-care, walking, climbing stairs--gradually increasing activities as tolerated.  You may have sexual intercourse when it is comfortable.  Refrain from any heavy lifting or straining until approved by your doctor. a. You may drive when you no longer are taking prescription pain medication, you can comfortably wear a seatbelt, and you can safely maneuver your car and apply brakes b. Return to Work: ___________________________________ 9. You should see your doctor in the office for a follow-up appointment approximately two weeks after your surgery.  Make sure that you call for this appointment within a day or two after you arrive home to insure a convenient appointment time. OTHER INSTRUCTIONS:  _____________________________________________________________ _____________________________________________________________  WHEN TO CALL YOUR DOCTOR: 1. Fever over 101.0 2. Inability to urinate 3. Nausea and/or vomiting 4. Extreme swelling or bruising 5. Continued bleeding from incision. 6. Increased pain, redness, or drainage from the incision. 7. Difficulty swallowing or breathing 8. Muscle cramping or spasms. 9. Numbness or tingling  in hands or feet or around lips.  The clinic staff is available to answer your questions during regular business hours.  Please dont hesitate to call and ask to speak to one of the nurses if you have concerns.  For further  questions, please visit [email or web address]

## 2016-02-16 NOTE — Discharge Summary (Signed)
Physician Discharge Summary  Patient ID: Jimmy Pearson MRN: [id number] DOB/AGE: 63/09/1950 63 y.o.  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  Pain initially controlled with PCA - transitioned to PO's.  Bowel function returned on POD #3.  Diet slowly advanced  Consults: Dietary  Significant Diagnostic Studies:none  Treatments: surgery: as above  Discharge Exam: Blood pressure 133/75, pulse 60, temperature 97.7 F (36.5 C), temperature source Oral, resp. rate 19, height 5\' 8"  (1.727 m), weight 107 kg (235 lb 14.3 oz), SpO2 100 %. General appearance: alert, cooperative and no distress Resp: clear to auscultation bilaterally Cardio: regular rate and rhythm, S1, S2 normal, no murmur, click, rub or gallop GI: mildly distended; + BS; incisional tenderness Port sites, midine incision c/d/i; RLQ ostomy site - minimal drainage with no cellulitis  Disposition: 01-Home or Self Care  Discharge Instructions    Call MD for:  persistant nausea and vomiting    Complete by:  As directed    Call MD for:  redness, tenderness, or signs of infection (pain, swelling, redness, odor or green/yellow discharge around incision site)    Complete by:  As directed    Call MD for:  severe uncontrolled pain    Complete by:  As directed    Call MD for:  temperature >100.4    Complete by:  As directed    Diet general    Complete by:  As directed    Discharge wound care:    Complete by:  As directed    Dry dressing to RLQ wound - change daily after showers   Driving Restrictions    Complete by:  As directed    Do not drive while taking pain medications   Increase activity slowly    Complete by:  As directed    May shower / Bathe    Complete by:  As directed         Medication List    STOP taking these medications   LORazepam 1 MG tablet Commonly known as:  ATIVAN   oxyCODONE-acetaminophen 5-325 MG tablet Commonly known as:  PERCOCET/ROXICET     TAKE these medications   amLODipine 5 MG tablet Commonly known as:  NORVASC Take 5 mg by mouth daily.   benazepril 20 MG tablet Commonly known as:  LOTENSIN Take 20 mg by mouth daily.   finasteride 5 MG tablet Commonly known as:  PROSCAR Take 5 mg by mouth daily at 6 PM.   montelukast 10 MG tablet Commonly known as:  SINGULAIR Take 1 tablet (10 mg total) by mouth at bedtime.   OVER THE COUNTER MEDICATION See admin instructions. Activz purified silver 60 mcg immune support: take 3 tsp (15 ml) by mouth daily, may also use 1 drop in each eye daily as needed for irritation/ itching   oxyCODONE 5 MG immediate release tablet Commonly known as:  Oxy IR/ROXICODONE Take 1-2 tablets (5-10 mg total) by mouth every 4 (four) hours as needed for moderate pain.      Follow-up Information    Lucious Zou K., MD. Call in 1 week(s).   Specialty:  General Surgery Why:  for staple removal Contact information: [address] [place] [id number] [phone]           Signed: Aneliz Carbary K. 02/16/2016, 7:57 AM

## 2016-02-16 NOTE — Plan of Care (Signed)
Problem: Food- and Nutrition-Related Knowledge Deficit (NB-1.1) Goal: Nutrition education Formal process to instruct or train a patient/client in a skill or to impart knowledge to help patients/clients voluntarily manage or modify food choices and eating behavior to maintain or improve health. Outcome: Completed/Met Date Met: 02/16/16 Nutrition Education Note  RD consulted for nutrition education.   RD provided "Fiber Restricted Nutrition Therapy" handout from the Academy of [employer] and Dietetics. Reviewed patient's dietary recall and discussed ways for pt to meet nutrition goals over the next several weeks. Explained reasons for pt to follow a low fiber diet. Reviewed low fiber foods and high fiber foods. Teach back method used. Pt verbalizes understanding of information provided.   Expect good compliance.  Body mass index is Body mass index is 35.87 kg/m.Marland Kitchen Pt meets criteria for class II obesity based on current BMI.  Current diet order is soft diet, patient is consuming approximately 100% of meals at this time. Labs and medications reviewed. No further nutrition interventions warranted at this time. RD contact information provided. If additional nutrition issues arise, please re-consult RD.  Corrin Parker, MS, RD, LDN Pager # [phone] After hours/ weekend pager # [phone]

## 2016-02-18 NOTE — Progress Notes (Signed)
Pt complained of pain.  Oxycodone 10 mg pulled but patient refused medication at the time.  Medication wasted in sink.  Waste witnessed by Shanon BrowJoyce Whitaker, RN.

## 2016-05-05 ENCOUNTER — Emergency Department (HOSPITAL_COMMUNITY)
Admission: EM | Admit: 2016-05-05 | Discharge: 2016-05-05 | Disposition: A | Discharge status: Home / Self Care | Payer: Medicare Other | Attending: Emergency Medicine | Admitting: Emergency Medicine

## 2016-05-05 ENCOUNTER — Encounter (HOSPITAL_COMMUNITY): Payer: Self-pay | Admitting: Emergency Medicine

## 2016-05-05 ENCOUNTER — Emergency Department (HOSPITAL_COMMUNITY): Payer: Medicare Other

## 2016-05-05 DIAGNOSIS — I10 Essential (primary) hypertension: Secondary | ICD-10-CM | POA: Diagnosis not present

## 2016-05-05 DIAGNOSIS — R109 Unspecified abdominal pain: Secondary | ICD-10-CM

## 2016-05-05 DIAGNOSIS — Z79899 Other long term (current) drug therapy: Secondary | ICD-10-CM | POA: Diagnosis not present

## 2016-05-05 DIAGNOSIS — R1032 Left lower quadrant pain: Secondary | ICD-10-CM | POA: Insufficient documentation

## 2016-05-05 HISTORY — DX: Unspecified hemorrhoids: K64.9

## 2016-05-05 LAB — CBC WITH DIFFERENTIAL/PLATELET
BASOS PCT: 1 %
Basophils Absolute: 0 10*3/uL (ref 0.0–0.1)
EOS ABS: 0.3 10*3/uL (ref 0.0–0.7)
Eosinophils Relative: 6 %
HEMATOCRIT: 47.5 % (ref 39.0–52.0)
HEMOGLOBIN: 16.3 g/dL (ref 13.0–17.0)
LYMPHS ABS: 1.6 10*3/uL (ref 0.7–4.0)
Lymphocytes Relative: 26 %
MCH: 30.2 pg (ref 26.0–34.0)
MCHC: 34.3 g/dL (ref 30.0–36.0)
MCV: 88.1 fL (ref 78.0–100.0)
Monocytes Absolute: 0.4 10*3/uL (ref 0.1–1.0)
Monocytes Relative: 6 %
NEUTROS ABS: 3.8 10*3/uL (ref 1.7–7.7)
NEUTROS PCT: 61 %
Platelets: 167 10*3/uL (ref 150–400)
RBC: 5.39 MIL/uL (ref 4.22–5.81)
RDW: 14.5 % (ref 11.5–15.5)
WBC: 6.2 10*3/uL (ref 4.0–10.5)

## 2016-05-05 LAB — URINALYSIS, ROUTINE W REFLEX MICROSCOPIC
Bilirubin Urine: NEGATIVE
GLUCOSE, UA: NEGATIVE mg/dL
HGB URINE DIPSTICK: NEGATIVE
Ketones, ur: NEGATIVE mg/dL
Leukocytes, UA: NEGATIVE
Nitrite: NEGATIVE
Protein, ur: 30 mg/dL — AB
Specific Gravity, Urine: 1.021 (ref 1.005–1.030)
pH: 6 (ref 5.0–8.0)

## 2016-05-05 LAB — COMPREHENSIVE METABOLIC PANEL
ALBUMIN: 4.5 g/dL (ref 3.5–5.0)
ALK PHOS: 74 U/L (ref 38–126)
ALT: 26 U/L (ref 17–63)
AST: 24 U/L (ref 15–41)
Anion gap: 5 (ref 5–15)
BILIRUBIN TOTAL: 0.8 mg/dL (ref 0.3–1.2)
BUN: 17 mg/dL (ref 6–20)
CALCIUM: 10.1 mg/dL (ref 8.9–10.3)
CO2: 32 mmol/L (ref 22–32)
CREATININE: 0.94 mg/dL (ref 0.61–1.24)
Chloride: 101 mmol/L (ref 101–111)
GFR calc Af Amer: >60 mL/min (ref >60)
GFR calc non Af Amer: >60 mL/min (ref >60)
GLUCOSE: 109 mg/dL — AB (ref 65–99)
Potassium: 4.2 mmol/L (ref 3.5–5.1)
SODIUM: 138 mmol/L (ref 135–145)
Total Protein: 8.1 g/dL (ref 6.5–8.1)

## 2016-05-05 LAB — LIPASE, BLOOD: Lipase: 22 U/L (ref 11–51)

## 2016-05-05 MED ORDER — IOPAMIDOL (ISOVUE-300) INJECTION 61%
INTRAVENOUS | Status: AC
  Filled 2016-05-05: qty 30

## 2016-05-05 MED ORDER — SODIUM CHLORIDE 0.9 % IV BOLUS (SEPSIS)
1000.0000 mL | Freq: Once | INTRAVENOUS | Status: AC
  Administered 2016-05-05: 1000 mL via INTRAVENOUS

## 2016-05-05 MED ORDER — IOPAMIDOL (ISOVUE-300) INJECTION 61%
INTRAVENOUS | Status: AC
  Administered 2016-05-05: 100 mL via INTRAVENOUS
  Filled 2016-05-05: qty 100

## 2016-05-05 NOTE — ED Notes (Signed)
Patient transported to CT 

## 2016-05-05 NOTE — Discharge Instructions (Signed)
Continue taking your home medications as prescribed. I recommend following up with your primary care provider within the next week if your symptoms have not improved. I also recommend following up with your surgeon at your next follow up appointment of if your abdominal pain does not resolve. Please return to the Emergency Department if symptoms worsen or new onset of fever, chest pain, difficulty breathing, new/worsening abdominal pain, vomiting, unable to keep fluids down, rectal bleeding.

## 2016-05-05 NOTE — ED Notes (Signed)
Pt sts last food was over 12 hours ago, "just in case."

## 2016-05-05 NOTE — ED Provider Notes (Signed)
[facility]-EMERGENCY DEPT Provider Note   CSN: [id number] Arrival date & time: 05/05/16  [id number]     History   Chief Complaint Chief Complaint  Patient presents with  . Abdominal Pain  . Post-op Problem    HPI Jimmy Pearson is a 65 y.o. male.  HPI   Patient is a 65 year old male with history of diverticulitis s/p colon resection on 09/11/15 and ileostomy reversal on 02/11/16 presents to the ED with complaint of abdominal pain, onset 2 weeks. Patient reports having constant pain to the right side of his abdomen the past 2 weeks which has gradually worsened. Patient reports pain radiates to his back. Patient states pain is worse with deep breathing or when walking. Denies episode is similar symptoms in the past. Denies fever, chills, chest pain, shortness of breath, nausea, vomiting, constipation, rectal bleeding, urinary sxs. Denies taking any medications for his symptoms. Patient reports he has followed up with his surgeon, Dr. Corliss Skainssuei, and denies any complications s/p surgery.   Past Medical History:  Diagnosis Date  . Bronchitis   . Depression   . Diverticulitis   . Diverticulitis   . Enlarged prostate   . Hemorrhoids   . Hypertension   . Shortness of breath dyspnea    sometimes in the middle of the night  . Sleep apnea    has not had a sleep study done yet, was told he needed to have it done at last admission    Patient Active Problem List   Diagnosis Date Noted  . S/P ileostomy (HCC) 02/11/2016  . HTN (hypertension) 09/15/2015  . Colonic mass   . Hypoxia   . Colonic obstruction s/p subtotal colectomy/ileostomy 09/11/15 09/11/2015  . Colon obstruction 09/11/2015    Past Surgical History:  Procedure Laterality Date  . COLON RESECTION N/A 09/11/2015   Procedure: COLON RESECTION;  Surgeon: Manus RuddMatthew Tsuei, MD;  [facility];  Service: General;  Laterality: N/A;  . COLON RESECTION N/A 02/11/2016   Procedure: LAPAROSCOPIC ASSISTED ILEOSTOMY CLOSURE;  Surgeon: Manus RuddMatthew Tsuei, MD;   Location: [facility] OR;  Service: General;  Laterality: N/A;  . COLONOSCOPY    . COLOSTOMY N/A 09/11/2015   Procedure: COLOSTOMY;  Surgeon: Manus RuddMatthew Tsuei, MD;  Location: [facility] OR;  Service: General;  Laterality: N/A;  . HERNIA REPAIR Right    inguinal hernia  . ILEOSTOMY CLOSURE N/A 02/11/2016  . LAPAROTOMY  09/11/2015   Procedure: EXPLORATORY LAPAROTOMY;  Surgeon: Manus RuddMatthew Tsuei, MD;  Location: [facility] OR;  Service: General;;  . SIGMOIDOSCOPY         Home Medications    Prior to Admission medications   Medication Sig Start Date End Date Taking? Authorizing Provider  amLODipine (NORVASC) 5 MG tablet Take 5 mg by mouth daily. 01/10/16   Historical Provider, MD  benazepril (LOTENSIN) 20 MG tablet Take 20 mg by mouth daily. 12/11/15   Historical Provider, MD  finasteride (PROSCAR) 5 MG tablet Take 5 mg by mouth daily at 6 PM.     Historical Provider, MD  montelukast (SINGULAIR) 10 MG tablet Take 1 tablet (10 mg total) by mouth at bedtime. Patient not taking: Reported on 02/02/2016 09/16/15   Ashok NorrisEmina Riebock, NP  OVER THE COUNTER MEDICATION See admin instructions. Activz purified silver 60 mcg immune support: take 3 tsp (15 ml) by mouth daily, may also use 1 drop in each eye daily as needed for irritation/ itching    Historical Provider, MD  oxyCODONE (OXY IR/ROXICODONE) 5 MG immediate release tablet Take 1-2 tablets (5-10 mg  total) by mouth every 4 (four) hours as needed for moderate pain. 02/13/16   Manus Rudd, MD    Family History Family History  Problem Relation Age of Onset  . Colon cancer Father   . Alzheimer's disease Mother   . Parkinson's disease Mother     Social History Social History  Substance Use Topics  . Smoking status: Never Smoker  . Smokeless tobacco: Never Used  . Alcohol use 8.4 oz/week    14 Glasses of wine per week     Comment: wine with meals     Allergies   Patient has no known allergies.   Review of Systems Review of Systems  Gastrointestinal: Positive for abdominal  pain.  All other systems reviewed and are negative.    Physical Exam Updated Vital Signs BP 138/90   Pulse (!) 55   Temp 97.7 F (36.5 C) (Oral)   Resp 18   Ht 5\' 8"  (1.727 m)   Wt 99.8 kg   SpO2 93%   BMI 33.45 kg/m   Physical Exam  Constitutional: He is oriented to person, place, and time. He appears well-developed and well-nourished. No distress.  HENT:  Head: Normocephalic and atraumatic.  Mouth/Throat: Uvula is midline, oropharynx is clear and moist and mucous membranes are normal. No oropharyngeal exudate, posterior oropharyngeal edema, posterior oropharyngeal erythema or tonsillar abscesses. No tonsillar exudate.  Eyes: Conjunctivae and EOM are normal. Right eye exhibits no discharge. Left eye exhibits no discharge. No scleral icterus.  Neck: Normal range of motion. Neck supple.  Cardiovascular: Normal rate, regular rhythm, normal heart sounds and intact distal pulses.   Pulmonary/Chest: Effort normal and breath sounds normal. No respiratory distress. He has no wheezes. He has no rales. He exhibits no tenderness.  Abdominal: Soft. Bowel sounds are normal. He exhibits no distension and no mass. There is tenderness. There is no rigidity, no rebound, no guarding, no CVA tenderness and negative Murphy's sign. No hernia.  Mild TTP over LUQ, no peritoneal signs. Well healing surgical scars noted to midline and right mid abdomen; no surrounding swelling, erythema, warmth or drainage noted. No CVA tenderness.  Musculoskeletal: Normal range of motion. He exhibits no edema.  No midline C, T, or L tenderness. Full range of motion of neck and back. Full range of motion of bilateral upper and lower extremities, with 5/5 strength. Sensation intact. 2+ radial and PT pulses. Cap refill <2 seconds.   Neurological: He is alert and oriented to person, place, and time.  Skin: Skin is warm and dry. He is not diaphoretic.  Nursing note and vitals reviewed.    ED Treatments / Results   Labs (all labs ordered are listed, but only abnormal results are displayed) Labs Reviewed  COMPREHENSIVE METABOLIC PANEL - Abnormal; Notable for the following:       Result Value   Glucose, Bld 109 (*)    All other components within normal limits  URINALYSIS, ROUTINE W REFLEX MICROSCOPIC - Abnormal; Notable for the following:    Protein, ur 30 (*)    Bacteria, UA RARE (*)    Squamous Epithelial / LPF 0-5 (*)    All other components within normal limits  CBC WITH DIFFERENTIAL/PLATELET  LIPASE, BLOOD    EKG  EKG Interpretation None       Radiology Ct Abdomen Pelvis W Contrast  Result Date: 05/05/2016 CLINICAL DATA:  Abdominal pain. Status post near total colectomy with ileorectal anastomosis. EXAM: CT ABDOMEN AND PELVIS WITH CONTRAST TECHNIQUE: Multidetector CT imaging  of the abdomen and pelvis was performed using the standard protocol following bolus administration of intravenous contrast. CONTRAST:  100mL ISOVUE-300 IOPAMIDOL (ISOVUE-300) INJECTION 61% COMPARISON:  CT scan 09/11/2015 FINDINGS: Lower chest: The lung bases are clear. No pleural effusion. The heart is normal in size. No pericardial effusion. The distal esophagus is grossly normal. Hepatobiliary: No focal hepatic lesions or intrahepatic biliary dilatation. Gallbladder is normal. No common bile duct dilatation. Pancreas: No mass, inflammation or ductal dilatation. Spleen: Normal size.  No focal lesions. Adrenals/Urinary Tract: The adrenal glands and kidneys are unremarkable. No hydronephrosis or ureteral calculi. The bladder is unremarkable. Stomach/Bowel: Surgical changes from a near total colectomy. The rectum and lower part of the sigmoid colon are intact. There is a end to side appearing iliorectal anastomosis with a small portion of sigmoid colon adjacent to the ileum. No complicating features are identified. The small bowel is unremarkable. No obstruction or inflammation. The stomach and duodenum are unremarkable.  Vascular/Lymphatic: Stable mild tortuosity, ectasia and calcification of the abdominal aorta and iliac arteries. The major vascular structures are patent. No mesenteric or retroperitoneal mass or adenopathy. Reproductive: The prostate gland and seminal vesicles are unremarkable. Other: No pelvic mass or adenopathy. No free pelvic fluid collections. No inguinal mass or adenopathy. Expected surgical changes involving the anterior abdominal wall. No hernia. Musculoskeletal: No significant bony findings. IMPRESSION: 1. Surgical changes from a near total colectomy and iliorectal anastomosis. No complicating features. 2. No acute abdominal/pelvic findings, mass lesions or adenopathy. 3. Stable tortuosity, ectasia and calcification of abdominal aorta and iliac arteries. Electronically Signed   By: Rudie MeyerP.  Gallerani M.D.   On: 05/05/2016 11:36    Procedures Procedures (including critical care time)  Medications Ordered in ED Medications  iopamidol (ISOVUE-300) 61 % injection (not administered)  sodium chloride 0.9 % bolus 1,000 mL (0 mLs Intravenous Stopped 05/05/16 1008)  iopamidol (ISOVUE-300) 61 % injection (100 mLs Intravenous Contrast Given 05/05/16 1042)     Initial Impression / Assessment and Plan / ED Course  I have reviewed the triage vital signs and the nursing notes.  Pertinent labs & imaging results that were available during my care of the patient were reviewed by me and considered in my medical decision making (see chart for details).  Clinical Course     Patient presents with right-sided abdominal pain for the past 2 weeks. Denies fever, nausea, vomiting. History of diverticulitis s/p colon resection on 09/11/15 and ileostomy reversal on 02/11/16. VSS. Exam revealed mild TTP over LUQ, no peritoneal signs. Well healing surgical scars noted to midline and right mid abdomen without signs of infection or TTP. Remaining exam unremarkable. Labs and urine unremarkable. Due to patient's history and  reporting significant concern regarding his abdominal pain and his recent abdominal surgeries CT abdomen ordered for further evaluation. Revealed no acute findings, no complicated features noted to surgical changes of near total colectomy and ileorectal anastomosis. On reevaluation patient is resting comfortably in bed. Discussed results with patient. Pt able to tolerate PO. Patient has remained hemodynamically stable while in the ED. Advised patient to follow up with his PCP within the next week if his symptoms have not improved. Discussed or return precautions.  Final Clinical Impressions(s) / ED Diagnoses   Final diagnoses:  Abdominal pain, unspecified abdominal location    New Prescriptions Discharge Medication List as of 05/05/2016 12:07 PM       Barrett HenleNicole Elizabeth Rayaan Garguilo, PA-C 05/05/16 1231    Rolland PorterMark James, MD 05/13/16 [phone]

## 2016-05-05 NOTE — ED Triage Notes (Signed)
Pt presents with 2 weeks of gnawing pain, 2/10 pain, feels it when he breathes, and when lying in bed and turning to either side.  Pt had illeostomy reversal.  The pain is in the RUQ, near one of the incisions.  Pt also sts pain radiated straight through to the back.

## 2016-05-21 DIAGNOSIS — N400 Enlarged prostate without lower urinary tract symptoms: Secondary | ICD-10-CM | POA: Diagnosis not present

## 2016-05-21 DIAGNOSIS — Z933 Colostomy status: Secondary | ICD-10-CM | POA: Diagnosis not present

## 2016-05-21 DIAGNOSIS — I10 Essential (primary) hypertension: Secondary | ICD-10-CM | POA: Diagnosis not present

## 2016-09-29 DIAGNOSIS — L290 Pruritus ani: Secondary | ICD-10-CM | POA: Diagnosis not present

## 2016-09-29 DIAGNOSIS — Z09 Encounter for follow-up examination after completed treatment for conditions other than malignant neoplasm: Secondary | ICD-10-CM | POA: Diagnosis not present

## 2016-12-22 DIAGNOSIS — M545 Low back pain: Secondary | ICD-10-CM | POA: Diagnosis not present

## 2016-12-27 DIAGNOSIS — M545 Low back pain: Secondary | ICD-10-CM | POA: Diagnosis not present

## 2017-01-03 DIAGNOSIS — M545 Low back pain: Secondary | ICD-10-CM | POA: Diagnosis not present

## 2017-05-06 ENCOUNTER — Other Ambulatory Visit: Payer: Self-pay | Admitting: Family Medicine

## 2017-05-06 ENCOUNTER — Ambulatory Visit
Admission: RE | Admit: 2017-05-06 | Discharge: 2017-05-06 | Disposition: A | Discharge status: Home / Self Care | Payer: Medicare Other | Source: Ambulatory Visit | Attending: Family Medicine | Admitting: Family Medicine

## 2017-05-06 DIAGNOSIS — N400 Enlarged prostate without lower urinary tract symptoms: Secondary | ICD-10-CM | POA: Diagnosis not present

## 2017-05-06 DIAGNOSIS — R0600 Dyspnea, unspecified: Secondary | ICD-10-CM

## 2017-05-06 DIAGNOSIS — I10 Essential (primary) hypertension: Secondary | ICD-10-CM | POA: Diagnosis not present

## 2017-05-06 DIAGNOSIS — R0602 Shortness of breath: Secondary | ICD-10-CM | POA: Diagnosis not present

## 2017-05-06 DIAGNOSIS — R52 Pain, unspecified: Secondary | ICD-10-CM

## 2017-05-06 DIAGNOSIS — M7751 Other enthesopathy of right foot: Secondary | ICD-10-CM | POA: Diagnosis not present

## 2017-05-06 DIAGNOSIS — M79671 Pain in right foot: Secondary | ICD-10-CM | POA: Diagnosis not present

## 2019-06-07 DIAGNOSIS — I10 Essential (primary) hypertension: Secondary | ICD-10-CM | POA: Diagnosis not present

## 2019-06-07 DIAGNOSIS — E559 Vitamin D deficiency, unspecified: Secondary | ICD-10-CM | POA: Diagnosis not present

## 2019-06-07 DIAGNOSIS — Z131 Encounter for screening for diabetes mellitus: Secondary | ICD-10-CM | POA: Diagnosis not present

## 2019-06-07 DIAGNOSIS — N400 Enlarged prostate without lower urinary tract symptoms: Secondary | ICD-10-CM | POA: Diagnosis not present

## 2019-06-07 DIAGNOSIS — Z79899 Other long term (current) drug therapy: Secondary | ICD-10-CM | POA: Diagnosis not present

## 2019-06-07 DIAGNOSIS — Z125 Encounter for screening for malignant neoplasm of prostate: Secondary | ICD-10-CM | POA: Diagnosis not present

## 2019-12-05 IMAGING — DX DG CHEST 2V
2 series · 2 of 2 positions shown · non-contrast
Comparison: Chest radiograph September 23, 2015 and chest CT September 24, 2015

CLINICAL DATA: Shortness of breath and hypertension

EXAM:
CHEST  2 VIEW

[dg chest 2 view (1 of 2)]
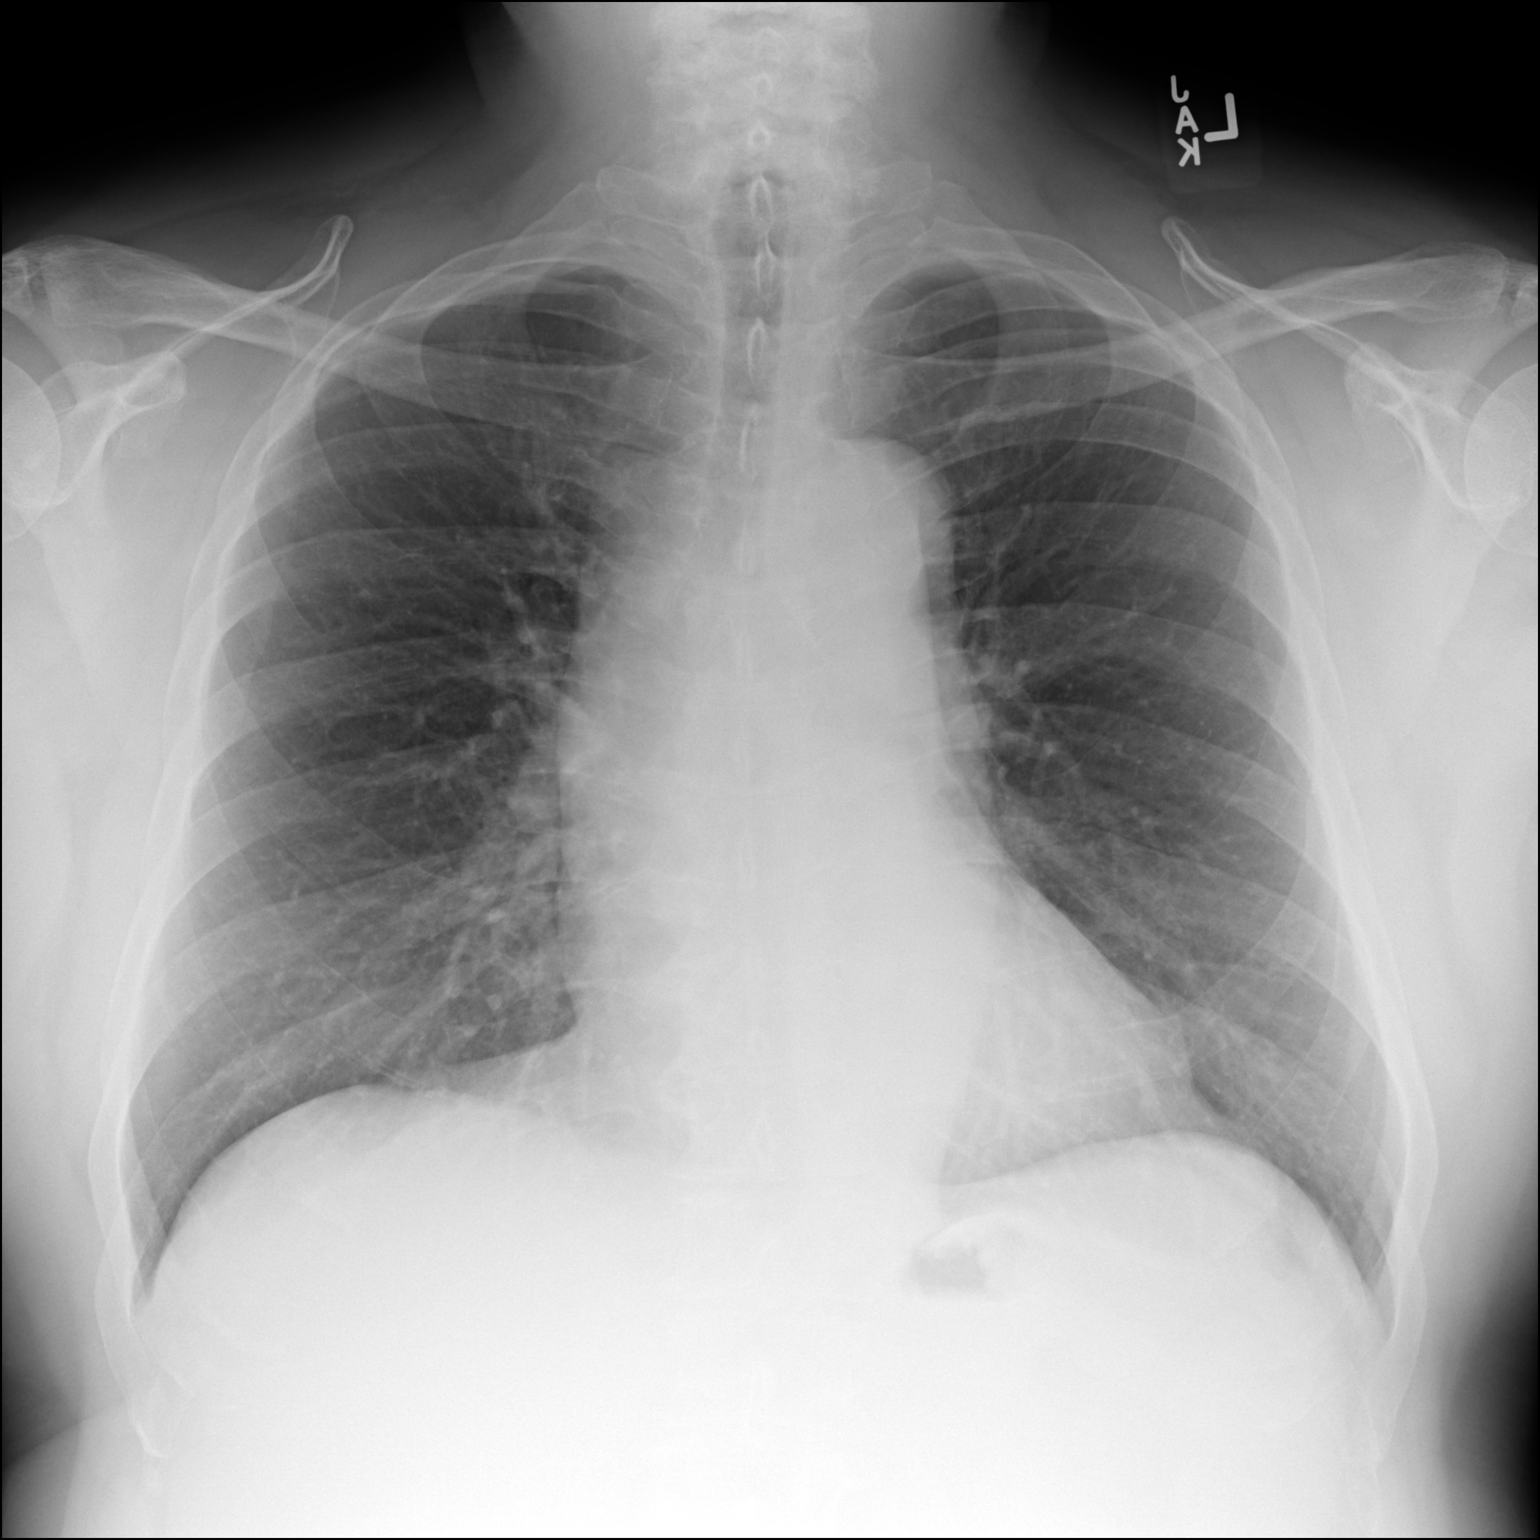

[dg chest 2 view (2 of 2)]
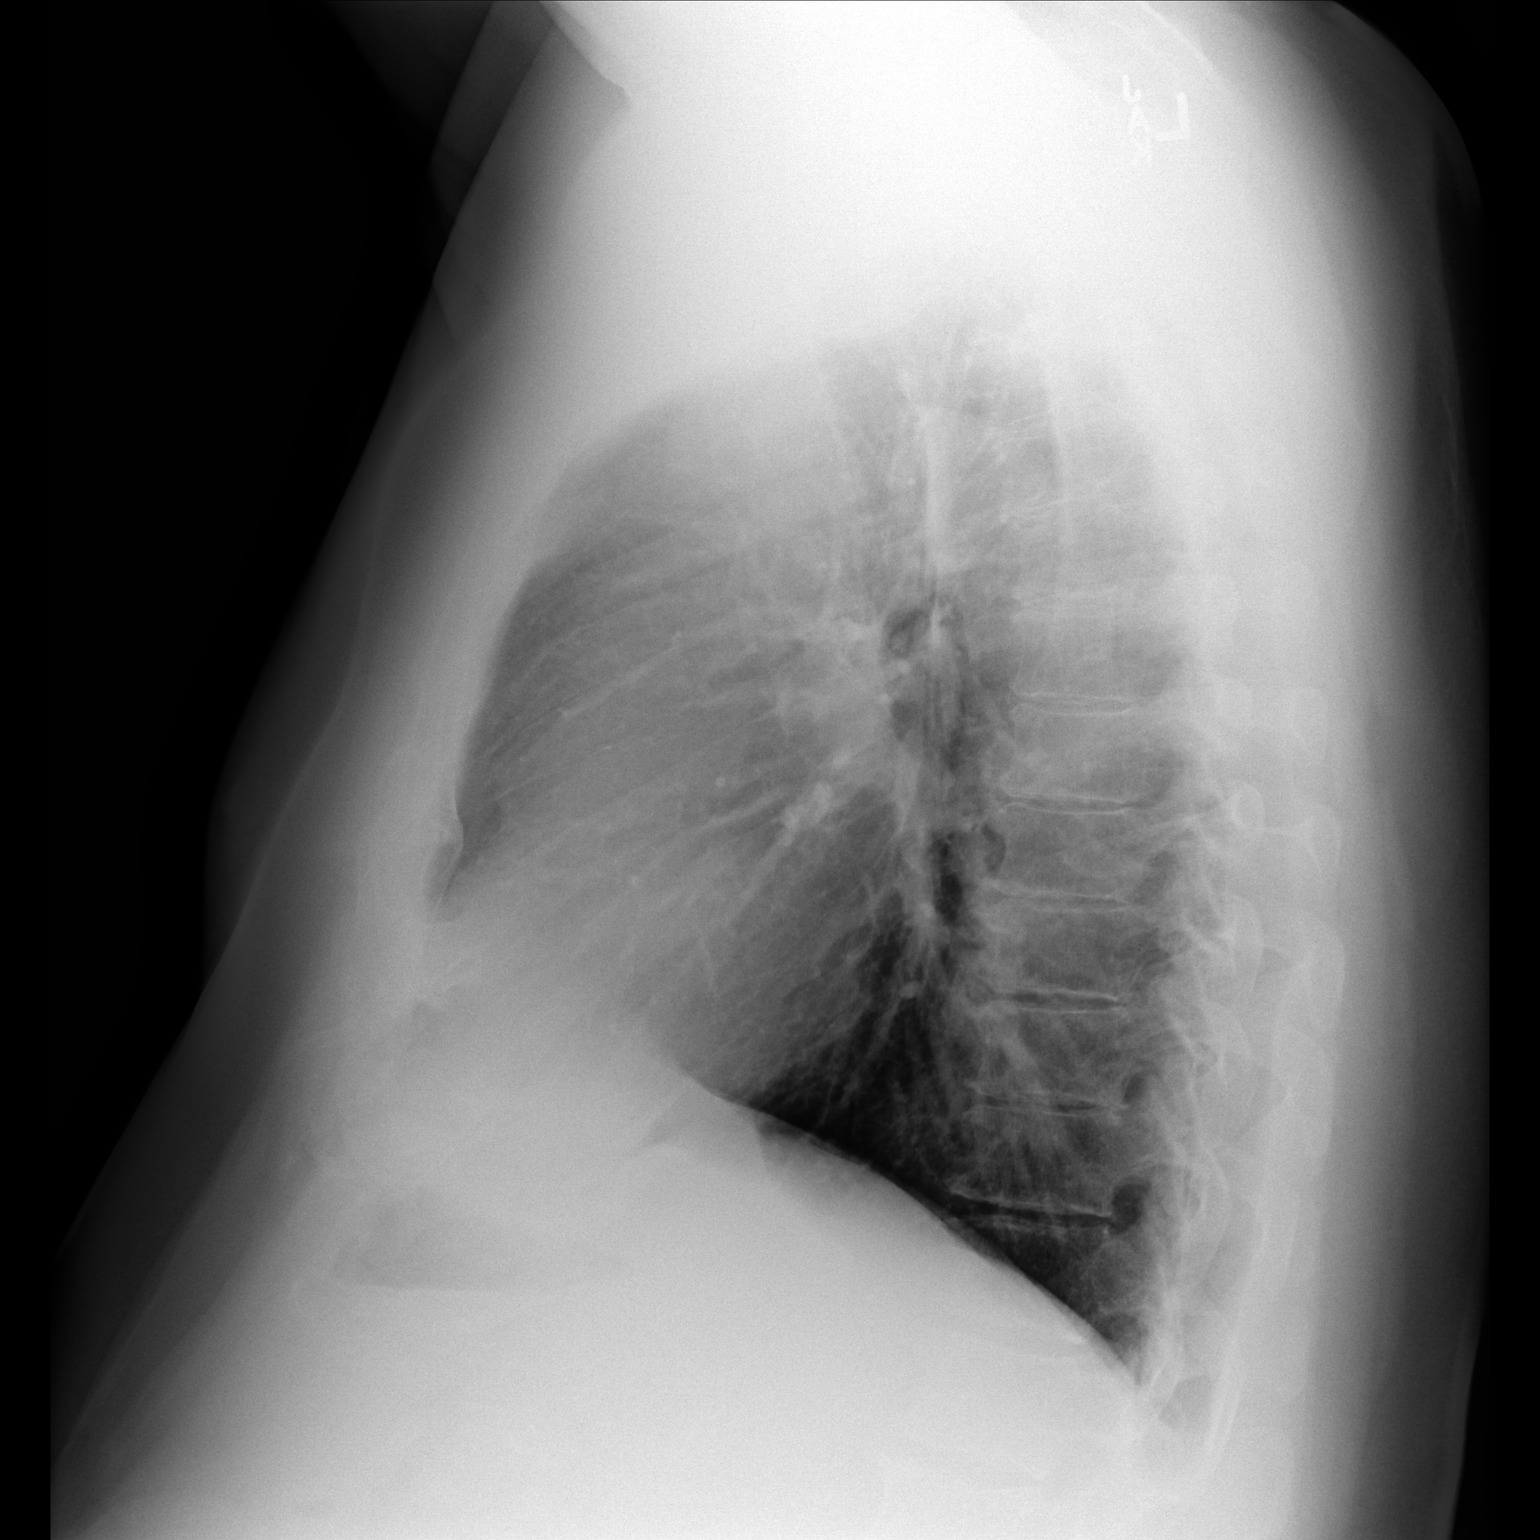

[2 of 2 positions shown; findings below may reference images not displayed]

FINDINGS: There is no edema or consolidation. Heart size and pulmonary
vascularity are normal. No adenopathy. There is mild degenerative
change in the thoracic spine.
IMPRESSION: No edema or consolidation.

## 2020-02-05 DIAGNOSIS — H9202 Otalgia, left ear: Secondary | ICD-10-CM | POA: Diagnosis not present

## 2020-02-05 DIAGNOSIS — H60501 Unspecified acute noninfective otitis externa, right ear: Secondary | ICD-10-CM | POA: Diagnosis not present

## 2020-02-05 DIAGNOSIS — I10 Essential (primary) hypertension: Secondary | ICD-10-CM | POA: Diagnosis not present

## 2020-02-07 DIAGNOSIS — H6090 Unspecified otitis externa, unspecified ear: Secondary | ICD-10-CM | POA: Diagnosis not present

## 2020-02-07 DIAGNOSIS — H6120 Impacted cerumen, unspecified ear: Secondary | ICD-10-CM | POA: Diagnosis not present

## 2020-02-07 DIAGNOSIS — I10 Essential (primary) hypertension: Secondary | ICD-10-CM | POA: Diagnosis not present

## 2023-11-25 ENCOUNTER — Ambulatory Visit: Payer: Self-pay

## 2023-11-25 NOTE — Telephone Encounter (Addendum)
 FYI Only or Action Required?: FYI only for provider.  Patient was last seen in primary care on n/a.  Called Nurse Triage reporting Hypertension.  Symptoms began yesterday.  Interventions attempted: Prescription medications: BP medication.  Symptoms are: gradually worsening.  Triage Disposition: See Physician Within 24 Hours: go to urgent care or ED  Patient/caregiver understands and will follow disposition?: Yes   Copied from CRM [id number]. Topic: Clinical - Red Word Triage >> Nov 25, 2023  2:06 PM Elle L wrote: Red Word that prompted transfer to Nurse Triage: The patient called to establish care at [facility]. However, he states his blood pressure medication has not been working and his blood pressure is worsening it has been 150/100 and as the first available appointment is until October he is requesting to see what Urgent Care he should go to. Reason for Disposition  Systolic BP >= 180 OR Diastolic >= 110  Answer Assessment - Initial Assessment Questions 1. BLOOD PRESSURE: What is your blood pressure? Did you take at least two measurements 5 minutes apart?     150/100 - has not taken BP today 2. ONSET: When did you take your blood pressure?     X 2 days 3. HOW: How did you take your blood pressure? (e.g., automatic home BP monitor, visiting nurse)     Automatic at store 4. HISTORY: Do you have a history of high blood pressure?     yes 5. MEDICINES: Are you taking any medicines for blood pressure? Have you missed any doses recently?     no 6. OTHER SYMPTOMS: Do you have any symptoms? (e.g., blurred vision, chest pain, difficulty breathing, headache, weakness)     Tingling all over at times. 7. PREGNANCY: Is there any chance you are pregnant? When was your last menstrual period?     na  Protocols used: Blood Pressure - High-A-AH

## 2024-01-02 ENCOUNTER — Ambulatory Visit: Payer: Self-pay | Admitting: Nurse Practitioner
